# Patient Record
Sex: Female | Born: 1970 | Race: Black or African American | Hispanic: No | Marital: Single
Health system: Midwestern US, Community
[De-identification: ages and names within clinical notes are randomized; demographics above are authoritative.]

## PROBLEM LIST (undated history)

## (undated) MED ORDER — PREDNISONE 50 MG TAB
50 mg | ORAL_TABLET | Freq: Every day | ORAL | Status: AC
Start: ? — End: 2013-03-26

## (undated) MED ORDER — IBUPROFEN 800 MG TAB
800 mg | ORAL_TABLET | Freq: Three times a day (TID) | ORAL | Status: DC | PRN
Start: ? — End: 2013-03-23

## (undated) MED ORDER — CEPHALEXIN 500 MG CAP
500 mg | ORAL_CAPSULE | Freq: Two times a day (BID) | ORAL | Status: AC
Start: ? — End: 2012-11-23

## (undated) MED ORDER — HYDROCODONE-ACETAMINOPHEN 5 MG-500 MG TAB
5-500 mg | ORAL_TABLET | ORAL | Status: AC | PRN
Start: ? — End: 2007-09-08

## (undated) MED ORDER — ONDANSETRON 8 MG TAB, RAPID DISSOLVE
8 mg | ORAL_TABLET | Freq: Three times a day (TID) | ORAL | Status: DC | PRN
Start: ? — End: 2013-03-23

## (undated) MED ORDER — NAPROXEN 500 MG TAB
500 mg | ORAL_TABLET | Freq: Two times a day (BID) | ORAL | Status: AC
Start: ? — End: 2013-04-07

## (undated) MED ORDER — TRIMETHOPRIM-SULFAMETHOXAZOLE 160 MG-800 MG TAB
160-800 mg | ORAL_TABLET | Freq: Two times a day (BID) | ORAL | Status: AC
Start: ? — End: 2007-09-11

---
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
## 2006-12-30 NOTE — ED Notes (Signed)
I have reviewed discharge instructions with the patient.  The patient verbalized understanding.

## 2007-09-01 NOTE — ED Provider Notes (Signed)
HPI Comments: Patient c/o painful sore that have increased over the past 2 days. She started with 2 lesions under her right arm 2 days ago. 1 of them ruptured on its own and expressed yellow drainage. 2 more have formed on her lower abdomen. Denies fever. She admits to exposure to MRSA from her grandmother about 1 year ago.     Skin Problem  The history is provided by the patient. This is a new problem. The current episode started 2 days ago. The problem has been gradually worsening.        No past medical history on file.     Past Surgical History   Procedure Date   ??? Hx orthopaedic      right arm           No family history on file.     History   Social History   ??? Marital Status: Single     Spouse Name: N/A     Number of Children: N/A   ??? Years of Education: N/A   Occupational History   ??? Not on file.   Social History Main Topics   ??? Tobacco Use: Yes -- 0.5 packs/day   ??? Alcohol Use: No   ??? Drug Use: No   ??? Sexually Active:    Other Topics Concern   ??? Not on file   Social History Narrative   ??? No narrative on file           ALLERGIES: Review of patient's allergies indicates no known allergies.      Review of Systems   Constitutional: Negative for fever and chills.   Gastrointestinal: Negative for nausea and vomiting.   Skin: Positive for wound.   All other systems reviewed and are negative.      Filed Vitals:    09/01/2007  6:57 PM   BP: 110/70   Pulse: 68   Temp: 98.9 ??F (37.2 ??C)   Resp: 18   Height: 5\' 6"  (1.676 m)   Weight: 115 lb (52.164 kg)   SpO2: 98%              Physical Exam   Nursing note and vitals reviewed.  Constitutional: She is oriented. She appears well-developed and well-nourished. She appears not diaphoretic. No distress.   HENT:   Head: Normocephalic and atraumatic.   Nose: Nose normal.   Mouth/Throat: Oropharynx is clear and moist.   Eyes: Conjunctivae and extraocular motions are normal. Pupils are equal, round, and reactive to light.   Neck: Normal range of motion. Neck supple.    Cardiovascular: Normal rate, regular rhythm and normal heart sounds.    Pulmonary/Chest: Effort normal and breath sounds normal.   Musculoskeletal: Normal range of motion. She exhibits no edema and no tenderness.   Neurological: She is alert and oriented.   Skin: Skin is warm and dry. She is not diaphoretic.                 Coding

## 2007-11-21 MED ORDER — METHYLPREDNISOLONE 4 MG TABS IN A DOSE PACK
4 mg | PACK | ORAL | Status: DC
Start: 2007-11-21 — End: 2008-02-08

## 2007-11-21 NOTE — ED Notes (Signed)
I have reviewed discharge instructions with the patient.  The patient verbalized understanding.  I have given the patient written information about [facility].   To make follow up appt. with referral physician.  Return if gets worse.

## 2007-11-21 NOTE — ED Provider Notes (Signed)
HPI Comments: 37 bf complains of facial swelling started today. No sob, nausea or vomiting. No fever.         No past medical history on file.     Past Surgical History   Procedure Date   ??? Hx orthopaedic      right arm           No family history on file.     History   Social History   ??? Marital Status: Single     Spouse Name: N/A     Number of Children: N/A   ??? Years of Education: N/A   Occupational History   ??? Not on file.   Social History Main Topics   ??? Tobacco Use: Yes -- 0.5 packs/day   ??? Alcohol Use: No   ??? Drug Use: No   ??? Sexually Active:    Other Topics Concern   ??? Not on file   Social History Narrative   ??? No narrative on file           ALLERGIES: Review of patient's allergies indicates no known allergies.      Review of Systems   HENT: Positive for facial swelling.    All other systems reviewed and are negative.      Filed Vitals:    11/21/2007 10:27 AM   BP: 92/64   Pulse: 64   Temp: 97.2 ??F (36.2 ??C)   Resp: 18   Height: 5\' 6"  (1.676 m)   Weight: 115 lb (52.164 kg)              Physical Exam   Nursing note and vitals reviewed.  Constitutional: She is oriented. She appears well-developed and well-nourished.   HENT:   Head: Normocephalic and atraumatic.   Right Ear: External ear normal.   Left Ear: External ear normal.   Nose: Nose normal.   Mouth/Throat: Oropharynx is clear and moist.   Eyes: Conjunctivae and extraocular motions are normal. Pupils are equal, round, and reactive to light.   Neck: Normal range of motion. Neck supple.   Cardiovascular: Normal rate, regular rhythm, normal heart sounds and intact distal pulses.    Pulmonary/Chest: Effort normal and breath sounds normal. No respiratory distress. She has no wheezes. She has no rales. She exhibits no tenderness.   Abdominal: Soft. Bowel sounds are normal.   Musculoskeletal: Normal range of motion.   Neurological: She is alert and oriented.   Skin: Skin is warm and dry.         No obvious facial assymetry appreciated. No erythenma or induration.             Coding

## 2007-11-21 NOTE — ED Notes (Signed)
Pt states she awoke a few days ago with pain on the right side of her face.  No swelling or deformity noted to face at this time.

## 2008-02-08 MED ORDER — CEPHALEXIN 500 MG CAP
500 mg | ORAL_CAPSULE | Freq: Four times a day (QID) | ORAL | Status: AC
Start: 2008-02-08 — End: 2008-02-15

## 2008-02-08 NOTE — ED Provider Notes (Signed)
Patient is a 37 y.o. female presenting with cough. The history is provided by the patient.   Cough  This is a new problem. The current episode started more than 1 week ago. The problem occurs constantly. The problem has not changed since onset. The cough is non-productive. There has been no fever. Associated symptoms include headaches. Treatments Tried: otc cold meds. The treatment provided mild relief. She is a smoker.        No past medical history on file.     Past Surgical History   Procedure Date   ??? Hx orthopaedic      right arm           No family history on file.     History   Social History   ??? Marital Status: Single     Spouse Name: N/A     Number of Children: N/A   ??? Years of Education: N/A   Occupational History   ??? Not on file.   Social History Main Topics   ??? Tobacco Use: Yes -- 0.5 packs/day   ??? Alcohol Use: Yes      every other day   ??? Drug Use: No   ??? Sexually Active:    Other Topics Concern   ??? Not on file   Social History Narrative   ??? No narrative on file           ALLERGIES: Review of patient's allergies indicates no known allergies.      Review of Systems   Respiratory: Positive for cough.    Neurological: Positive for headaches.   All other systems reviewed and are negative.      Filed Vitals:    02/08/2008  9:35 AM   BP: 113/62   Pulse: 72   Temp: 97.8 ??F (36.6 ??C)   Resp: 18   Height: 5\' 6"  (1.676 m)   Weight: 115 lb (52.164 kg)   SpO2: 99%              Physical Exam   Nursing note and vitals reviewed.  Constitutional: She is oriented. She appears well-developed and well-nourished. No distress.   HENT:   Head: Normocephalic and atraumatic.   Nose: Nose normal.   Mouth/Throat: Oropharynx is clear and moist.        tms not seen due to cerumen   Eyes: Conjunctivae and extraocular motions are normal. Pupils are equal, round, and reactive to light.   Neck: Normal range of motion. Neck supple.   Cardiovascular: Normal rate and regular rhythm.     Pulmonary/Chest: Effort normal and breath sounds normal. No respiratory distress. She has no wheezes.   Abdominal: Soft. Bowel sounds are normal.   Musculoskeletal: Normal range of motion. She exhibits no edema.   Neurological: She is alert and oriented.   Skin: Skin is warm.            MDM Coding   Reviewed: previous chart, nursing note and vitals          Procedures

## 2008-02-21 NOTE — ED Notes (Signed)
This chart was signed for an administrative requirement since I was present during the assigned shift of this mid-level.  I had no direct or indirect contact with this patient nor was I involved in the management or disposition of this patient.

## 2008-11-25 MED ORDER — PROMETHAZINE 25 MG TAB
25 mg | ORAL_TABLET | Freq: Four times a day (QID) | ORAL | Status: DC | PRN
Start: 2008-11-25 — End: 2008-11-25

## 2008-11-25 MED ORDER — PROMETHAZINE 6.25 MG/5 ML SYRUP
6.25 mg/5 mL | Freq: Four times a day (QID) | ORAL | Status: AC | PRN
Start: 2008-11-25 — End: 2008-12-02

## 2008-11-25 NOTE — ED Provider Notes (Signed)
HPI Comments: She is here today c/o N/V/D for 2 day. A/so, c/o of back pain for 3-4 months, no change. Her kids had same symptoms over past week. Denies fever, chill, urinary complaints, vaginal discharge or bleeding. She, also, has athletes foot and currently putting clotramizole on it.     Patient is a 38 y.o. female presenting with nausea. The history is provided by the patient.   Nausea   This is a new problem. The current episode started 2 days ago. The problem occurs continuously. The problem has not changed since onset. The emesis has an appearance of stomach contents. There has been no fever. Associated symptoms include diarrhea. Pertinent negatives include no chills, no fever, no sweats, no abdominal pain, no headaches, no arthralgias, no myalgias, no cough and no URI. vomit x 1 yesterday, none today. The patient is not pregnant.Risk factors include ill contacts.        No past medical history on file.     Past Surgical History   Procedure Date   ??? Hx orthopaedic      right arm           No family history on file.     History   Social History   ??? Marital Status: Single     Spouse Name: N/A     Number of Children: N/A   ??? Years of Education: N/A   Occupational History   ??? Not on file.   Social History Main Topics   ??? Tobacco Use: Yes -- 0.5 packs/day   ??? Alcohol Use: Yes      every other day   ??? Drug Use: No   ??? Sexually Active: Yes     Birth Control/ Protection: None   Other Topics Concern   ??? Not on file   Social History Narrative   ??? No narrative on file           ALLERGIES: Review of patient's allergies indicates no known allergies.      Review of Systems   Constitutional: Negative.  Negative for fever and chills.   HENT: Negative.  Negative for ear pain, congestion, sore throat, rhinorrhea, sneezing, drooling, mouth sores, trouble swallowing, neck pain, neck stiffness, voice change, postnasal drip and sinus pressure.    Eyes: Negative.  Negative for pain and redness.    Respiratory: Negative.  Negative for cough, chest tightness and shortness of breath.    Cardiovascular: Negative.  Negative for chest pain, palpitations and leg swelling.   Gastrointestinal: Positive for nausea, vomiting and diarrhea. Negative for abdominal pain and constipation.   Genitourinary: Negative.  Negative for dysuria, frequency, hematuria, flank pain, vaginal bleeding, vaginal discharge, difficulty urinating, genital sore, vaginal pain, menstrual problem, pelvic pain and dyspareunia.   Musculoskeletal: Positive for back pain. Negative for myalgias and arthralgias.   Skin: Negative.  Negative for rash.   Neurological: Negative.  Negative for dizziness, light-headedness and headaches.   Psychiatric/Behavioral: Negative.    All other systems reviewed and are negative.        Filed Vitals:    11/25/2008  4:03 PM   BP: 120/67   Pulse: 70   Temp: 95.4 ??F (35.2 ??C)   Resp: 18   Height: 5\' 6"  (1.676 m)   Weight: 115 lb (52.164 kg)              Physical Exam   Nursing note and vitals reviewed.  Constitutional: She is oriented to person, place, and time. She appears  well-developed and well-nourished. No distress.   HENT:   Head: Normocephalic and atraumatic.   Eyes: Conjunctivae are normal.   Neck: Normal range of motion. Neck supple.   Cardiovascular: Normal rate, regular rhythm, normal heart sounds and intact distal pulses.    Pulmonary/Chest: Effort normal and breath sounds normal. No respiratory distress. She has no wheezes. She has no rales. She exhibits no tenderness.   Abdominal: Soft. Bowel sounds are normal. She exhibits no distension. No tenderness. She has no rebound and no guarding.   Musculoskeletal: Normal range of motion.        Lumbar back: She exhibits pain. She exhibits normal range of motion, no tenderness, no bony tenderness, no swelling, no edema, no deformity, no laceration, no spasm and normal pulse.        Back:     Neurological: She is alert and oriented to person, place, and time.    Skin: Skin is warm and dry. She is not diaphoretic.   Psychiatric: She has a normal mood and affect. Her behavior is normal.        MDM Coding   Reviewed: nursing note and vitals        Procedures    A&O in NAD, VSS.

## 2008-11-25 NOTE — ED Notes (Signed)
I have reviewed discharge instructions with the patient.  The patient verbalized understanding.

## 2008-11-27 NOTE — ED Notes (Signed)
Note signed for administrative purposes only. I did not see or have any input in the care of this patient.

## 2009-03-02 NOTE — ED Notes (Signed)
Pt did not answer call back to room.

## 2009-03-02 NOTE — ED Notes (Signed)
Pt told registration they were leaving.

## 2009-03-04 MED ORDER — DOXYCYCLINE HYCLATE 100 MG TAB
100 mg | ORAL_TABLET | Freq: Two times a day (BID) | ORAL | Status: AC
Start: 2009-03-04 — End: 2009-03-11

## 2009-03-04 MED ORDER — CHLORPHENIRAMINE-PHENYLEPHRINE-DM 2 MG-6 MG-15 MG/5 ML ORAL SYRUP
2-6-15 mg/5 mL | Freq: Four times a day (QID) | ORAL | Status: DC | PRN
Start: 2009-03-04 — End: 2010-01-16

## 2009-03-04 NOTE — ED Notes (Signed)
To room 11 ambulatory  C/o  Chest congestion, cough, also has diarrhea

## 2009-03-04 NOTE — ED Provider Notes (Signed)
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   No childhood asthma.    Started work @ [employer] 2 months ago so had been treating sinus sx's adequately w/ pre-shift DayQuil.    Patient is a 38 y.o. female presenting with nasal congestion. The history is provided by the patient.   Nasal Congestion   Associated symptoms include congestion, cough and rhinorrhea. Pertinent negatives include no chills, no ear pain, no sore throat, no shortness of breath and no chest pain.        No past medical history on file.     Past Surgical History   Procedure Date   ??? Hx orthopaedic      right arm           No family history on file.     History   Social History   ??? Marital Status: Single     Spouse Name: N/A     Number of Children: N/A   ??? Years of Education: N/A   Occupational History   ??? Not on file.   Social History Main Topics   ??? Smoking status: Current Everyday Smoker -- 0.5 packs/day   ??? Smokeless tobacco: Never Used   ??? Alcohol Use: Yes      every other day   ??? Drug Use: No   ??? Sexually Active: Yes     Birth Control/ Protection: None   Other Topics Concern   ??? Not on file   Social History Narrative   ??? No narrative on file           ALLERGIES: Review of patient's allergies indicates no known allergies.      Review of Systems   Constitutional: Positive for appetite change and fatigue. Negative for fever, chills and activity change.   HENT: Positive for congestion, rhinorrhea, voice change and postnasal drip. Negative for ear pain and sore throat.    Respiratory: Positive for cough and chest tightness. Negative for shortness of breath.    Cardiovascular: Negative for chest pain.   Gastrointestinal: Negative for vomiting and abdominal pain.   Musculoskeletal: Negative for myalgias.   Neurological: Negative for light-headedness.   All other systems reviewed and are negative.        Filed Vitals:    03/04/2009  8:09 AM   BP: 99/61   Pulse: 70    Temp: 99.1 ??F (37.3 ??C)   Resp: 17   Height: 5\' 6"  (1.676 m)   Weight: 120 lb (54.432 kg)   SpO2: 100%              Physical Exam   Vitals reviewed.  Constitutional: She appears well-developed. No distress.   HENT:   Head: Normocephalic.   Right Ear: Tympanic membrane normal.   Left Ear: Tympanic membrane normal.   Nose: Mucosal edema and rhinorrhea present. Right sinus exhibits no maxillary sinus tenderness. Left sinus exhibits no maxillary sinus tenderness.   Mouth/Throat: Posterior oropharyngeal erythema (cobblestoning) present. No oropharyngeal exudate or posterior oropharyngeal edema.   Cardiovascular: Normal rate.    Pulmonary/Chest: Effort normal. No accessory muscle usage. Not tachypneic. No respiratory distress. She has no wheezes. She has rhonchi. She has no rales.   Abdominal: Soft.   Musculoskeletal: She exhibits no edema.   Neurological: She is alert.   Skin: Skin is warm.   Psychiatric: She has a normal mood and affect.  Coding    Room Air Oxygen saturation normal, no intervention necessary    Procedures

## 2009-03-04 NOTE — ED Notes (Signed)
Instructions to patient  Work note, 2 prescriptions, bronchitis in adults  Information sheet  Pt. Understands instructions

## 2010-01-16 MED ORDER — KETOROLAC TROMETHAMINE 60 MG/2 ML IM
60 mg/2 mL | INTRAMUSCULAR | Status: AC
Start: 2010-01-16 — End: 2010-01-16
  Administered 2010-01-16: 06:00:00 via INTRAMUSCULAR

## 2010-01-16 MED ORDER — ACETAMINOPHEN-CODEINE 300 MG-30 MG TAB
300-30 mg | ORAL_TABLET | ORAL | Status: DC | PRN
Start: 2010-01-16 — End: 2010-03-24

## 2010-01-16 MED ORDER — DICLOFENAC 50 MG TAB
50 mg | ORAL_TABLET | Freq: Three times a day (TID) | ORAL | Status: DC
Start: 2010-01-16 — End: 2010-03-24

## 2010-01-16 MED FILL — KETOROLAC TROMETHAMINE 60 MG/2 ML IM: 60 mg/2 mL | INTRAMUSCULAR | Qty: 2

## 2010-01-16 NOTE — ED Notes (Signed)
I have reviewed discharge instructions with the patient.  The patient verbalized understanding. Pt ambulatory out of ER in NAD at this time, VS as noted, self to drive.

## 2010-01-16 NOTE — ED Provider Notes (Signed)
Patient is a 39 y.o. female presenting with shoulder pain. The history is provided by the patient.   Shoulder Pain   Incident onset: pain constantly for 1 month.  reports repetative motiion as possible cause.  works doing Education officer, environmental. Injury mechanism: as above. The right shoulder is affected. The pain is at a severity of 10/10. The pain has been constant since onset. The pain does not radiate. She has no other injuries. There is no history of shoulder surgery. Pertinent negatives include no numbness, no muscle weakness and no tingling. She reports no foreign bodies present.        No past medical history on file.     Past Surgical History   Procedure Date   ??? Hx orthopaedic      right arm           No family history on file.     History   Social History   ??? Marital Status: Single     Spouse Name: N/A     Number of Children: N/A   ??? Years of Education: N/A   Occupational History   ??? Not on file.   Social History Main Topics   ??? Smoking status: Current Everyday Smoker -- 0.5 packs/day   ??? Smokeless tobacco: Never Used   ??? Alcohol Use: Yes      every other day   ??? Drug Use: No   ??? Sexually Active: Yes     Birth Control/ Protection: None   Other Topics Concern   ??? Not on file   Social History Narrative   ??? No narrative on file                    ALLERGIES: Review of patient's allergies indicates no known allergies.      Review of Systems   Cardiovascular: Negative for chest pain.   Musculoskeletal: Positive for arthralgias.   Neurological: Negative.  Negative for tingling and numbness.   All other systems reviewed and are negative.        Filed Vitals:    01/16/10 0024   BP: 116/79   Pulse: 78   Temp: 98.1 ??F (36.7 ??C)   Resp: 18   Height: 5\' 6"  (1.676 m)   Weight: 125 lb (56.7 kg)   SpO2: 99%              Physical Exam   Constitutional: She is oriented to person, place, and time. She appears well-developed and well-nourished. No distress.        Sleeping on cart upon entering room     HENT:    Head: Normocephalic and atraumatic.   Eyes: EOM are normal. Pupils are equal, round, and reactive to light.   Neck: Normal range of motion. Neck supple.   Pulmonary/Chest: Effort normal.   Musculoskeletal:        Right shoulder: She exhibits decreased range of motion, tenderness, pain and decreased strength. She exhibits no deformity.        Mild impingement signs   Neurological: She is alert and oriented to person, place, and time. She displays normal reflexes. She exhibits normal muscle tone.   Skin: Skin is warm and dry.   Psychiatric: She has a normal mood and affect. Her behavior is normal.        MDM    Procedures

## 2010-01-16 NOTE — ED Notes (Signed)
Pt states pain to right shoulder for over a month. Pt states it is in relation to surgery she had on right forearm. Pt in NAD at this time.

## 2010-03-24 MED ORDER — AMOXICILLIN 400 MG/5 ML ORAL SUSP
400 mg/5 mL | Freq: Two times a day (BID) | ORAL | Status: AC
Start: 2010-03-24 — End: 2010-04-03

## 2010-03-24 MED ORDER — HYDROCODONE-ACETAMINOPHEN 7.5 MG-500 MG/15 ML (15 ML) ORAL SOLN
Freq: Four times a day (QID) | ORAL | Status: DC | PRN
Start: 2010-03-24 — End: 2011-01-08

## 2010-03-24 MED ORDER — PROMETHAZINE 25 MG/5 ML ORAL SYRUP
25 mg/5 mL | Freq: Three times a day (TID) | ORAL | Status: DC | PRN
Start: 2010-03-24 — End: 2011-01-08

## 2010-03-24 NOTE — ED Provider Notes (Addendum)
Patient is a 40 y.o. female presenting with dental problem. The history is provided by the patient.   Dental Pain   This is a new problem. The current episode started 12 to 24 hours ago. The problem occurs constantly. The problem has been gradually worsening. The pain is located in the right lower mouth.The quality of the pain is aching.  The pain is at a severity of 10/10. Associated symptoms include gum redness.There was no vomiting, no nausea, no fever, no swelling, no chest pain, no shortness of breath, no headaches and no drainage. She has tried nothing for the symptoms. The patient has no cardiac history.       No past medical history on file.     Past Surgical History   Procedure Date   ??? Hx orthopaedic      right arm           No family history on file.     History   Social History   ??? Marital Status: Single     Spouse Name: N/A     Number of Children: N/A   ??? Years of Education: N/A   Occupational History   ??? Not on file.   Social History Main Topics   ??? Smoking status: Current Everyday Smoker -- 0.5 packs/day   ??? Smokeless tobacco: Never Used   ??? Alcohol Use: No      sober since 0101/12   ??? Drug Use: No   ??? Sexually Active: Yes     Birth Control/ Protection: None   Other Topics Concern   ??? Not on file   Social History Narrative   ??? No narrative on file                    ALLERGIES: Review of patient's allergies indicates no known allergies.      Review of Systems   Constitutional: Negative.    HENT: Positive for dental problem.    Eyes: Negative.    Respiratory: Negative.    Cardiovascular: Negative.    Gastrointestinal: Negative.    Genitourinary: Negative.    Musculoskeletal: Negative.    Skin: Negative.    Neurological: Negative.    Hematological: Negative.    Psychiatric/Behavioral: Negative.    All other systems reviewed and are negative.        Filed Vitals:    03/24/10 1216   BP: 124/73   Pulse: 83   Temp: 100.6 ??F (38.1 ??C)   Resp: 18   Height: 5\' 6"  (1.676 m)   Weight: 115 lb (52.164 kg)    SpO2: 99%              Physical Exam   Nursing note and vitals reviewed.  Constitutional: She is oriented to person, place, and time. She appears well-developed and well-nourished.   HENT:   Head: Normocephalic and atraumatic.   Right Ear: External ear normal.   Left Ear: External ear normal.   Nose: Nose normal.   Mouth/Throat: Uvula is midline and oropharynx is clear and moist. Abnormal dentition. Dental abscesses and dental caries present.   Eyes: Conjunctivae and EOM are normal. Pupils are equal, round, and reactive to light.   Neck: Normal range of motion. Neck supple.   Cardiovascular: Normal rate, regular rhythm, normal heart sounds and intact distal pulses.    Pulmonary/Chest: Effort normal and breath sounds normal.   Abdominal: Soft. Bowel sounds are normal.   Musculoskeletal: Normal range of motion.   Neurological:  She is alert and oriented to person, place, and time. She has normal reflexes.   Skin: Skin is warm and dry.   Psychiatric: She has a normal mood and affect. Her behavior is normal. Judgment and thought content normal.        MDM    Procedures    The patient was observed in the ED.      I discussed the results of all labs, procedures, radiographs, and treatments with the patient and available family.  Treatment plan is agreed upon and the patient is ready for discharge.  All voiced understanding of the discharge plan and medication instructions or changes as appropriate.  Questions about treatment in the ED were answered.  All were encouraged to return should symptoms worsen or new problems develop.  Pt declines shot here in ED today

## 2010-03-24 NOTE — ED Notes (Signed)
I have reviewed discharge instructions with the patient.  The patient verbalized understanding. To follow up with primary physician and return with worsening.

## 2011-01-08 LAB — URIC ACID: Uric acid: 3.3 MG/DL (ref 2.6–6.0)

## 2011-01-08 MED ORDER — HYDROCODONE-ACETAMINOPHEN 5 MG-325 MG TAB
5-325 mg | ORAL_TABLET | ORAL | Status: DC | PRN
Start: 2011-01-08 — End: 2011-01-16

## 2011-01-08 MED ORDER — TOLNAFTATE 1 % TOPICAL SPRAY
1 % | CUTANEOUS | Status: DC
Start: 2011-01-08 — End: 2011-01-16

## 2011-01-08 NOTE — ED Notes (Signed)
.  I have reviewed discharge instructions with the patient.  The patient verbalized understanding.

## 2011-01-08 NOTE — ED Provider Notes (Signed)
HPI Comments: Pt here today with a c/o atraumatic BL foot pain "for years".    Patient is a 40 y.o. female presenting with foot pain. The history is provided by the patient.   Foot Pain   This is a chronic problem. The pain is present in the right foot and left foot. The quality of the pain is described as aching. The pain is at a severity of 10/10. The pain is mild. Pertinent negatives include no numbness, full range of motion, no stiffness, no tingling, no itching, no back pain and no neck pain. The symptoms are aggravated by movement. There has been no history of extremity trauma.        No past medical history on file.     Past Surgical History   Procedure Date   ??? Hx orthopaedic      right arm         No family history on file.     History     Social History   ??? Marital Status: Life Partner     Spouse Name: N/A     Number of Children: N/A   ??? Years of Education: N/A     Occupational History   ??? Not on file.     Social History Main Topics   ??? Smoking status: Current Everyday Smoker -- 0.5 packs/day   ??? Smokeless tobacco: Never Used   ??? Alcohol Use: No      sober since 0101/12   ??? Drug Use: No   ??? Sexually Active: Yes     Birth Control/ Protection: None     Other Topics Concern   ??? Not on file     Social History Narrative   ??? No narrative on file                  ALLERGIES: Review of patient's allergies indicates no known allergies.      Review of Systems   Constitutional: Negative.    HENT: Negative.  Negative for neck pain.    Eyes: Negative.    Respiratory: Negative.    Cardiovascular: Negative.    Gastrointestinal: Negative.    Genitourinary: Negative.    Musculoskeletal: Negative for myalgias, back pain, joint swelling, arthralgias, gait problem and stiffness.        BL foot pain     Skin: Negative.  Negative for itching.   Neurological: Negative.  Negative for tingling and numbness.   Hematological: Negative.    Psychiatric/Behavioral: Negative.        Filed Vitals:    01/08/11 1115   BP: 101/61    Pulse: 63   Temp: 98.9 ??F (37.2 ??C)   Resp: 18   Height: 5\' 6"  (1.676 m)   Weight: 52.164 kg (115 lb)   SpO2: 100%            Physical Exam   Nursing note and vitals reviewed.  Constitutional: She is oriented to person, place, and time. Vital signs are normal. She appears well-developed and well-nourished. She is active.  Non-toxic appearance. She does not have a sickly appearance. She does not appear ill. No distress.   HENT:   Head: Normocephalic and atraumatic.   Eyes: Conjunctivae and EOM are normal. Pupils are equal, round, and reactive to light.   Neck: Normal range of motion. Neck supple.   Cardiovascular: Normal rate, regular rhythm, normal heart sounds and intact distal pulses.    Pulmonary/Chest: Effort normal and breath sounds normal.  Abdominal: Soft. Bowel sounds are normal.   Musculoskeletal:        Right foot: She exhibits normal range of motion, no tenderness, no bony tenderness, no swelling, normal capillary refill, no crepitus, no deformity and no laceration.        Left foot: She exhibits normal range of motion, no tenderness, no bony tenderness, no swelling, normal capillary refill, no crepitus, no deformity and no laceration.   Neurological: She is alert and oriented to person, place, and time.   Skin: Skin is warm and dry. She is not diaphoretic.        Psychiatric: She has a normal mood and affect. Her behavior is normal. Judgment and thought content normal.        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 with the patient and available family.?? A treatment plan was developed in conjunction with the patient and was agreed upon. The patient is ready for discharge at this time.?? All voiced understanding of the discharge plan and medication instructions or changes as appropriate.?? Questions about treatment in the ED were answered.?? The patient was encouraged to return should symptoms worsen or new problems develop. A follow up physician was provided to the patient on the discharge papers.

## 2011-01-16 LAB — CBC WITH AUTOMATED DIFF
ABS. BASOPHILS: 0 10*3/uL (ref 0.0–0.2)
ABS. EOSINOPHILS: 0.2 10*3/uL (ref 0.0–0.8)
ABS. IMM. GRANS.: 0 10*3/uL (ref 0.0–0.5)
ABS. LYMPHOCYTES: 1.6 10*3/uL (ref 0.5–4.6)
ABS. MONOCYTES: 1.1 10*3/uL (ref 0.1–1.3)
ABS. NEUTROPHILS: 6 10*3/uL (ref 1.7–8.2)
BASOPHILS: 0 % (ref 0.0–2.0)
EOSINOPHILS: 2 % (ref 0.5–7.8)
HCT: 38.7 % (ref 35.8–46.3)
HGB: 13.1 g/dL (ref 11.7–15.4)
IMMATURE GRANULOCYTES: 0.2 % (ref 0.0–5.0)
LYMPHOCYTES: 18 % (ref 13–44)
MCH: 30.1 PG (ref 26.1–32.9)
MCHC: 33.9 g/dL (ref 31.4–35.0)
MCV: 89 FL (ref 79.6–97.8)
MONOCYTES: 12 % (ref 4.0–12.0)
MPV: 10 FL — ABNORMAL LOW (ref 10.8–14.1)
NEUTROPHILS: 68 % (ref 43–78)
PLATELET: 228 10*3/uL (ref 150–450)
RBC: 4.35 M/uL (ref 4.05–5.25)
RDW: 13.5 % (ref 11.9–14.6)
WBC: 9 10*3/uL (ref 4.3–11.1)

## 2011-01-16 MED ORDER — TRIMETHOPRIM-SULFAMETHOXAZOLE 160 MG-800 MG TAB
160-800 mg | ORAL | Status: DC
Start: 2011-01-16 — End: 2011-01-16

## 2011-01-16 MED ORDER — TRIMETHOPRIM-SULFAMETHOXAZOLE 160 MG-800 MG TAB
160-800 mg | ORAL_TABLET | Freq: Two times a day (BID) | ORAL | Status: AC
Start: 2011-01-16 — End: 2011-01-23

## 2011-01-16 MED ORDER — TRIMETHOPRIM-SULFAMETHOXAZOLE 40 MG-200 MG/5 ML ORAL SUSP
200-40 mg/5 mL | Freq: Two times a day (BID) | ORAL | Status: AC
Start: 2011-01-16 — End: 2011-01-26

## 2011-01-16 MED ADMIN — sulfamethoxazole-trimethoprim (BACTRIM;SEPTRA) 200-40 mg/5 mL oral suspension 10 mL: ORAL | @ 07:00:00 | NDC 00472128533

## 2011-01-16 NOTE — ED Notes (Signed)
C/o wound to right foot, between 2nd and 3rd toes. States seen by [employer] and  Placed on amoxicillin couple days ago. States when removed sock this evening noticed blood and puss to sock. States told by [place] to come to ed.

## 2011-01-16 NOTE — ED Provider Notes (Signed)
HPI Comments: 40 yo black female presents with purulent discharge from her right foot.  Patient has had tinea pedis and was seen buy her PCP 3 days ago for cellulitis as a result of the skin cracking-she was placed on amoxil, but this has become more severe with increasing pain and now purulent discharge.  No fever, chills, sweats and no nausea or vomiting.  No DM.    Patient is a 40 y.o. female presenting with foot pain. The history is provided by the patient.   Foot Pain   This is a new problem. The current episode started more than 2 days ago. The problem has been gradually worsening. The pain is present in the right foot and right toes. The pain is at a severity of 9/10. Pertinent negatives include no numbness, full range of motion, no tingling and no itching. The symptoms are aggravated by palpation and movement. Treatments tried: Antibiotics. The treatment provided no relief. There has been no history of extremity trauma.        No past medical history on file.     Past Surgical History   Procedure Date   ??? Hx orthopaedic      right arm         No family history on file.     History     Social History   ??? Marital Status: Life Partner     Spouse Name: N/A     Number of Children: N/A   ??? Years of Education: N/A     Occupational History   ??? Not on file.     Social History Main Topics   ??? Smoking status: Current Everyday Smoker -- 0.5 packs/day   ??? Smokeless tobacco: Never Used   ??? Alcohol Use: No      sober since 0101/12   ??? Drug Use: No   ??? Sexually Active: Yes     Birth Control/ Protection: None     Other Topics Concern   ??? Not on file     Social History Narrative   ??? No narrative on file                  ALLERGIES: Review of patient's allergies indicates no known allergies.      Review of Systems   Constitutional: Positive for activity change. Negative for fever, chills, diaphoresis, appetite change and fatigue.   Gastrointestinal: Negative for nausea and vomiting.    Musculoskeletal: Negative for joint swelling and arthralgias.   Skin: Positive for color change and wound. Negative for itching, pallor and rash.   Neurological: Negative for dizziness, tingling, light-headedness and numbness.   Psychiatric/Behavioral: Negative for confusion.       Filed Vitals:    01/16/11 0013   BP: 117/68   Pulse: 62   Temp: 98.9 ??F (37.2 ??C)   Resp: 16   Height: 5\' 6"  (1.676 m)   Weight: 52.164 kg (115 lb)   SpO2: 100%            Physical Exam   Nursing note and vitals reviewed.  Constitutional: She appears well-developed and well-nourished. She appears distressed.   HENT:   Head: Normocephalic and atraumatic.   Eyes: Conjunctivae and EOM are normal. Right eye exhibits no discharge. Left eye exhibits no discharge. No scleral icterus.   Neck: Normal range of motion. Neck supple.   Cardiovascular: Normal rate.    Pulmonary/Chest: Effort normal. No respiratory distress.   Musculoskeletal: She exhibits edema and tenderness.  Moderate edema and erythema of the dorsal aspect of the right foot with a draining abscess on the dorsal aspect of the 3rd right toe.  No ascending lymphangitis.   Neurological: She is alert. She exhibits normal muscle tone. Coordination normal.   Skin: Skin is warm and dry. No rash noted. She is not diaphoretic. There is erythema. No pallor.        Tinea pedis bilaterally, but patient has a draining abscess on the right foot.   Psychiatric: She has a normal mood and affect.        MDM     Differential Diagnosis; Clinical Impression; Plan:     CBC WNL.  Amount and/or Complexity of Data Reviewed:   Clinical lab tests:  Ordered and reviewed  Discussion of test results with the performing providers:  No   Decide to obtain previous medical records or to obtain history from someone other than the patient:  No   Obtain history from someone other than the patient:  No   Discuss the patient with another provider:  No   Risk of Significant Complications, Morbidity, and/or Mortality:   Presenting problems:  Moderate  Diagnostic procedures:  Moderate  Management options:  Moderate  Progress:   Patient progress:  Stable      Procedures

## 2011-01-16 NOTE — ED Notes (Signed)
I have reviewed discharge instructions with the patient.  The patient verbalized understanding.  Questions about medication answered.  Prescription for Bactrim liquid given.  Discharged to lobby via WC.

## 2011-01-17 NOTE — Progress Notes (Signed)
Quick Note:    Pt on septra  ______

## 2011-01-18 LAB — CULTURE, WOUND W GRAM STAIN: GRAM STAIN: 0

## 2011-01-18 NOTE — Progress Notes (Signed)
Quick Note:    Susceptible to Bactrim, continue current medication.  ______

## 2011-05-04 NOTE — ED Provider Notes (Addendum)
HPI Comments: Pt. Presents stating that she has had pain to both feet since her surgery to her right foot which was performed by her podiatrist 2 weeks ago. She states she also is aware she has athletes feet and has not been using anything for this. She states she places gauze between her toes as she has done today to minimize this moistness. She states she is "out of" her pain medication for pain related to her surgery. I discussed with pt. That she would need to contact her podiatrist who performed the surgery for this prescription.    The history is provided by the patient.        No past medical history on file.     Past Surgical History   Procedure Date   ??? Hx orthopaedic      right arm, toe         No family history on file.     History     Social History   ??? Marital Status: LIFE PARTNER     Spouse Name: N/A     Number of Children: N/A   ??? Years of Education: N/A     Occupational History   ??? Not on file.     Social History Main Topics   ??? Smoking status: Current Everyday Smoker -- 0.5 packs/day   ??? Smokeless tobacco: Never Used   ??? Alcohol Use: No      occasional   ??? Drug Use: No   ??? Sexually Active: Yes     Birth Control/ Protection: None     Other Topics Concern   ??? Not on file     Social History Narrative   ??? No narrative on file                  ALLERGIES: Review of patient's allergies indicates no known allergies.      Review of Systems   Constitutional: Negative for fever, chills, diaphoresis and fatigue.   Gastrointestinal: Negative for nausea and vomiting.   Musculoskeletal: Positive for arthralgias. Negative for myalgias, joint swelling and gait problem.   Skin: Positive for rash. Negative for color change and wound.   Neurological: Negative for weakness and numbness.   All other systems reviewed and are negative.        Filed Vitals:    05/04/11 0851   BP: 98/55   Pulse: 81   Temp: 98.2 ??F (36.8 ??C)   Resp: 18   Height: 5\' 6"  (1.676 m)   Weight: 50.349 kg (111 lb)   SpO2: 99%            Physical Exam    Nursing note and vitals reviewed.  Constitutional: She is oriented to person, place, and time. Vital signs are normal. She appears well-developed and well-nourished. She is active.  Non-toxic appearance. She does not appear ill. No distress.   Cardiovascular:   Pulses:       Radial pulses are 2+ on the right side, and 2+ on the left side.   Musculoskeletal:        Right foot: She exhibits normal range of motion, no tenderness, no bony tenderness, no swelling, normal capillary refill, no crepitus and no deformity.        Left foot: Normal. She exhibits normal range of motion, no tenderness, no bony tenderness, no swelling, normal capillary refill, no crepitus and no deformity.   Neurological: She is alert and oriented to person, place, and time. She has normal  strength. No cranial nerve deficit or sensory deficit. Coordination and gait normal.   Skin: Skin is warm.        Psychiatric: She has a normal mood and affect. Her speech is normal and behavior is normal.        MDM     Risk of Significant Complications, Morbidity, and/or Mortality:   Presenting problems:  Minimal  Management options:  Minimal  Progress:   Patient progress:  Stable      Procedures

## 2011-05-04 NOTE — ED Notes (Signed)
I have reviewed discharge instructions with the patient.  The patient verbalized understanding.

## 2011-06-01 MED ORDER — PENICILLIN V-K 500 MG TAB
500 mg | ORAL_TABLET | Freq: Four times a day (QID) | ORAL | Status: AC
Start: 2011-06-01 — End: 2011-06-11

## 2011-06-01 MED ORDER — TRAMADOL 50 MG TAB
50 mg | ORAL_TABLET | Freq: Four times a day (QID) | ORAL | Status: DC | PRN
Start: 2011-06-01 — End: 2012-11-16

## 2011-06-01 NOTE — ED Provider Notes (Signed)
HPI Comments: 41 yo female c/o dental pain to the right upper jaw that started about 3 days ago. Patient denies drainage from the site. States it started after eating some chicken wings at dinner. Patient has not taken anything for pain. Patient has not seen a dentist for this problem.       Pmhx:   Shx: 1/2 ppd     Patient is a 41 y.o. female presenting with dental problem. The history is provided by the patient.   Dental Pain   This is a new problem. The current episode started more than 2 days ago. The problem occurs constantly. The problem has not changed since onset.The pain is located in the right upper mouth.The quality of the pain is aching and stabbing.  The pain is moderate. There was no vomiting, no nausea, no fever, no swelling, no chest pain, no shortness of breath, no headaches, no gum redness and no drainage. She has tried nothing for the symptoms.        No past medical history on file.     Past Surgical History   Procedure Date   ??? Hx orthopaedic      right arm, toe         No family history on file.     History     Social History   ??? Marital Status: LIFE PARTNER     Spouse Name: N/A     Number of Children: N/A   ??? Years of Education: N/A     Occupational History   ??? Not on file.     Social History Main Topics   ??? Smoking status: Current Everyday Smoker -- 0.5 packs/day   ??? Smokeless tobacco: Never Used   ??? Alcohol Use: No      occasional   ??? Drug Use: No   ??? Sexually Active: Yes     Birth Control/ Protection: None     Other Topics Concern   ??? Not on file     Social History Narrative   ??? No narrative on file                  ALLERGIES: Review of patient's allergies indicates no known allergies.      Review of Systems   Constitutional: Negative for fever, chills, diaphoresis, fatigue and unexpected weight change.   HENT: Positive for dental problem. Negative for sore throat and trouble swallowing.    Respiratory: Negative for cough, chest tightness and shortness of breath.    Cardiovascular: Negative  for chest pain and palpitations.   Gastrointestinal: Negative for nausea, vomiting, diarrhea and constipation.   Skin: Negative for color change and pallor.   Psychiatric/Behavioral: Negative for behavioral problems, confusion and agitation.       Filed Vitals:    06/01/11 1245   BP: 133/82   Pulse: 74   Temp: 98.3 ??F (36.8 ??C)   Resp: 16   Height: 5\' 6"  (1.676 m)   Weight: 50.349 kg (111 lb)   SpO2: 100%            Physical Exam   Nursing note and vitals reviewed.  Constitutional: She is oriented to person, place, and time. Vital signs are normal. She appears well-developed and well-nourished.  Non-toxic appearance. She does not have a sickly appearance. She does not appear ill. No distress.   HENT:   Head: Normocephalic and atraumatic. No trismus in the jaw.   Right Ear: Hearing, tympanic membrane, external ear and ear  canal normal.   Left Ear: Hearing, tympanic membrane, external ear and ear canal normal.   Nose: Nose normal.   Mouth/Throat: Uvula is midline, oropharynx is clear and moist and mucous membranes are normal. She does not have dentures. No oral lesions. Abnormal dentition. Dental caries present. No dental abscesses, uvula swelling or lacerations.       Eyes: Conjunctivae and EOM are normal. Pupils are equal, round, and reactive to light. Right eye exhibits no discharge. Left eye exhibits no discharge. No scleral icterus.   Neck: Normal range of motion. Neck supple.   Cardiovascular: Normal rate, regular rhythm, S1 normal, S2 normal and normal heart sounds.  Exam reveals no gallop.    No murmur heard.  Pulmonary/Chest: Effort normal and breath sounds normal. No respiratory distress. She has no decreased breath sounds. She has no wheezes. She has no rhonchi. She has no rales.   Neurological: She is alert and oriented to person, place, and time.   Skin: Skin is warm, dry and intact. No abrasion, no bruising, no burn, no ecchymosis, no laceration, no lesion and no rash noted. She is not diaphoretic. No  erythema. No pallor.   Psychiatric: She has a normal mood and affect. Her speech is normal and behavior is normal. Judgment and thought content normal.        MDM     Risk of Significant Complications, Morbidity, and/or Mortality:   Presenting problems:  Low  Diagnostic procedures:  Low  Management options:  Low      Procedures    I have discussed the results of labs, procedures, radiographs, treatments as well as any previous results found within the [facility] [employer] [facility] with the patient and available family.?? A treatment plan was developed in conjunction with the patient and was agreed upon. The patient is ready for discharge at this time.?? All voiced understanding of the discharge plan and medication instructions or changes as appropriate.?? Questions about treatment in the ED were answered.?? The patient was encouraged to return should symptoms worsen or new problems develop. A follow up physician was provided to the patient on the discharge papers.

## 2011-06-01 NOTE — ED Notes (Signed)
Pt reports pain to R upper mouth x 2-3 days, headache and facial pain as well, no OTC meds attempted.

## 2011-06-01 NOTE — ED Notes (Signed)
Copy of discharge instructions given to patient. Denies any questions at this time.

## 2012-11-16 LAB — URINE MICROSCOPIC

## 2012-11-16 LAB — HCG URINE, QL. - POC: Pregnancy test,urine (POC): NEGATIVE

## 2012-11-16 NOTE — ED Notes (Signed)
Blood drawn via butterfly ( L AC ) & sent to lab

## 2012-11-16 NOTE — ED Notes (Signed)
I have reviewed discharge instructions with the patient.  The patient verbalized understanding. Patient ambulatory to lobby in no acute distress. 3 prescriptions provided.    Nabilah Davoli M Senna Lape, RN

## 2012-11-16 NOTE — ED Notes (Signed)
Back in ED 15

## 2012-11-16 NOTE — ED Notes (Signed)
To CT

## 2012-11-16 NOTE — ED Provider Notes (Signed)
I was personally available for consultation in the emergency department.  I have reviewed the chart and agree with the documentation recorded by the MLP, including the assessment, treatment plan, and disposition.  Ammar Moffatt L Shaneika Rossa, MD

## 2012-11-16 NOTE — ED Provider Notes (Signed)
HPI Comments: Pt. Presents stating that she woke up yesterday with right temporal/parietal HA that has persisted today. She states she took a generic form of Excedrin Migraine which slightly relieved headache temporarily. She states she has a hx of migraine headaches. She states she has had nausea, lightheadedness and photophobia. She denies vomiting, ST, visual disturbances or neck pain. She states she smokes 1/2 pdd of cigarettes. LMP 10/26/2012.    The history is provided by the patient.        History reviewed. No pertinent past medical history.     Past Surgical History   Procedure Laterality Date   ??? Hx orthopaedic       right arm, toe         History reviewed. No pertinent family history.     History     Social History   ??? Marital Status: SINGLE     Spouse Name: N/A     Number of Children: N/A   ??? Years of Education: N/A     Occupational History   ??? Not on file.     Social History Main Topics   ??? Smoking status: Current Every Day Smoker -- 0.50 packs/day   ??? Smokeless tobacco: Never Used   ??? Alcohol Use: No      Comment: occasional   ??? Drug Use: No   ??? Sexually Active: Yes     Birth Control/ Protection: None     Other Topics Concern   ??? Not on file     Social History Narrative   ??? No narrative on file                  ALLERGIES: Review of patient's allergies indicates no known allergies.      Review of Systems   Constitutional: Negative for fever, chills, diaphoresis, activity change, appetite change and fatigue.   HENT: Negative for ear pain, congestion, sore throat, facial swelling, rhinorrhea, trouble swallowing, neck pain, neck stiffness, dental problem and postnasal drip.    Eyes: Positive for photophobia. Negative for pain and visual disturbance.   Respiratory: Negative for cough, chest tightness and shortness of breath.    Cardiovascular: Negative for chest pain and palpitations.   Gastrointestinal: Positive for nausea. Negative for vomiting, abdominal pain and diarrhea.   Musculoskeletal: Negative for  myalgias and arthralgias.   Neurological: Positive for light-headedness and headaches. Negative for dizziness, tremors, seizures, syncope, speech difficulty, weakness and numbness.   Psychiatric/Behavioral: Negative for sleep disturbance. The patient is not nervous/anxious.    All other systems reviewed and are negative.        Filed Vitals:    11/16/12 1833   BP: 126/79   Pulse: 70   Temp: 99.2 ??F (37.3 ??C)   Resp: 20   Height: 5\' 6"  (1.676 m)   Weight: 54.432 kg (120 lb)   SpO2: 99%            Physical Exam   Nursing note and vitals reviewed.  Constitutional: She is oriented to person, place, and time. Vital signs are normal. She appears well-developed and well-nourished. She is active.  Non-toxic appearance. She does not appear ill. No distress.   HENT:   Head: Normocephalic and atraumatic.   Right Ear: Tympanic membrane normal.   Left Ear: Tympanic membrane normal.   Nose: Nose normal.   Mouth/Throat: Uvula is midline, oropharynx is clear and moist and mucous membranes are normal.   Eyes: Conjunctivae and EOM are normal. Pupils are equal,  round, and reactive to light. No scleral icterus.   Neck: Normal range of motion and full passive range of motion without pain. Neck supple. No spinous process tenderness and no muscular tenderness present. No Brudzinski's sign and no Kernig's sign noted. No thyromegaly present.   Cardiovascular: Normal rate, regular rhythm, S1 normal, S2 normal, normal heart sounds and intact distal pulses.  Exam reveals no gallop and no friction rub.    No murmur heard.  Pulmonary/Chest: Effort normal and breath sounds normal. No respiratory distress. She has no decreased breath sounds. She has no wheezes. She has no rhonchi. She has no rales.   Abdominal: Soft. Normal appearance, normal aorta and bowel sounds are normal. She exhibits no distension, no abdominal bruit and no mass. There is no hepatosplenomegaly. There is no tenderness. There is no rigidity, no rebound, no guarding and no CVA  tenderness.   Musculoskeletal: Normal range of motion. She exhibits no edema and no tenderness.   Lymphadenopathy:     She has no cervical adenopathy.   Neurological: She is alert and oriented to person, place, and time. She has normal strength and normal reflexes. No cranial nerve deficit or sensory deficit. Coordination and gait normal.   No focal neurological deficits identified    Skin: Skin is warm, dry and intact. No cyanosis. Nails show no clubbing.   Psychiatric: She has a normal mood and affect. Her speech is normal and behavior is normal.        MDM     Differential Diagnosis; Clinical Impression; Plan:     Pt. States improvement after being medicated.  Amount and/or Complexity of Data Reviewed:   Clinical lab tests:  Ordered and reviewed  Tests in the radiology section of CPT??:  Ordered and reviewed   Discuss the patient with another provider:  No  Risk of Significant Complications, Morbidity, and/or Mortality:   Presenting problems:  Low  Diagnostic procedures:  Low  Management options:  Low  Progress:   Patient progress:  Improved and stable      Procedures    Recent Results (from the past 12 hour(s))   URINE MICROSCOPIC    Collection Time     11/16/12  6:00 PM       Result Value Range    WBC 20-50  0 /hpf    RBC 0-3  0 /hpf    Epithelial cells 10-20  0 /hpf    Bacteria 1+ (*) 0 /hpf    Casts 0-3  0 /lpf   HCG URINE, QL. - POC    Collection Time     11/16/12  6:56 PM       Result Value Range    Pregnancy test,urine (POC) NEGATIVE   NEG     CBC WITH AUTOMATED DIFF    Collection Time     11/16/12  8:10 PM       Result Value Range    WBC 8.7  4.3 - 11.1 K/uL    RBC 4.91  4.05 - 5.25 M/uL    HGB 15.0  11.7 - 15.4 g/dL    HCT 16.1  09.6 - 04.5 %    MCV 88.2  79.6 - 97.8 FL    MCH 30.5  26.1 - 32.9 PG    MCHC 34.6  31.4 - 35.0 g/dL    RDW 40.9  81.1 - 91.4 %    PLATELET 223  150 - 450 K/uL    MPV 10.8  10.8 - 14.1 FL  DF AUTOMATED      NEUTROPHILS 64  43 - 78 %    LYMPHOCYTES 26  13 - 44 %    MONOCYTES 7   4.0 - 12.0 %    EOSINOPHILS 3  0.5 - 7.8 %    BASOPHILS 0  0.0 - 2.0 %    IMMATURE GRANULOCYTES 0.2  0.0 - 5.0 %    ABS. NEUTROPHILS 5.5  1.7 - 8.2 K/UL    ABS. LYMPHOCYTES 2.2  0.5 - 4.6 K/UL    ABS. MONOCYTES 0.6  0.1 - 1.3 K/UL    ABS. EOSINOPHILS 0.2  0.0 - 0.8 K/UL    ABS. BASOPHILS 0.0  0.0 - 0.2 K/UL    ABS. IMM. GRANS. 0.0  0.0 - 0.5 K/UL   SED RATE, AUTOMATED    Collection Time     11/16/12  8:10 PM       Result Value Range    Sed rate, automated 19  0 - 20 mm/hr     CT HEAD WO CONT    Final Result: IMPRESSION: No acute findings          I discussed the results of all labs, procedures, radiographs, and treatments with the patient and available family. Treatment plan is agreed upon and the patient is ready for discharge. All voiced understanding of the discharge plan and medication instructions or changes as appropriate. Questions about treatment in the ED were answered. All were encouraged to return should symptoms worsen or new problems develop.

## 2012-11-16 NOTE — ED Notes (Signed)
Practioner discussing test results with patient.

## 2012-11-16 NOTE — ED Notes (Signed)
Pt st headache on right of head. Pt st blurry vision. Pt st has taken medication with minimal relief.

## 2012-11-17 LAB — CBC WITH AUTOMATED DIFF
ABS. BASOPHILS: 0 10*3/uL (ref 0.0–0.2)
ABS. EOSINOPHILS: 0.2 10*3/uL (ref 0.0–0.8)
ABS. IMM. GRANS.: 0 10*3/uL (ref 0.0–0.5)
ABS. LYMPHOCYTES: 2.2 10*3/uL (ref 0.5–4.6)
ABS. MONOCYTES: 0.6 10*3/uL (ref 0.1–1.3)
ABS. NEUTROPHILS: 5.5 10*3/uL (ref 1.7–8.2)
BASOPHILS: 0 % (ref 0.0–2.0)
EOSINOPHILS: 3 % (ref 0.5–7.8)
HCT: 43.3 % (ref 35.8–46.3)
HGB: 15 g/dL (ref 11.7–15.4)
IMMATURE GRANULOCYTES: 0.2 % (ref 0.0–5.0)
LYMPHOCYTES: 26 % (ref 13–44)
MCH: 30.5 PG (ref 26.1–32.9)
MCHC: 34.6 g/dL (ref 31.4–35.0)
MCV: 88.2 FL (ref 79.6–97.8)
MONOCYTES: 7 % (ref 4.0–12.0)
MPV: 10.8 FL (ref 10.8–14.1)
NEUTROPHILS: 64 % (ref 43–78)
PLATELET: 223 10*3/uL (ref 150–450)
RBC: 4.91 M/uL (ref 4.05–5.25)
RDW: 13.8 % (ref 11.9–14.6)
WBC: 8.7 10*3/uL (ref 4.3–11.1)

## 2012-11-17 LAB — SED RATE, AUTOMATED: Sed rate, automated: 19 mm/hr (ref 0–20)

## 2012-11-17 MED ADMIN — ondansetron (ZOFRAN ODT) tablet 8 mg: ORAL | NDC 68462015840

## 2012-11-17 MED ADMIN — methylPREDNISolone (PF) (SOLU-MEDROL) injection 125 mg: INTRAMUSCULAR | NDC 00009004725

## 2013-03-23 NOTE — ED Provider Notes (Signed)
HPI Comments: Pt also with rt knee pain for 2 months, no injury, worse at night, little relief to knee and wrist with tylenol no pmd     Patient is a 43 y.o. female presenting with wrist pain. The history is provided by the patient.   Wrist Pain   This is a chronic problem. Episode onset: 2 months  The problem occurs constantly. The problem has not changed since onset.The pain is present in the right wrist. The quality of the pain is described as aching. The pain is at a severity of 10/10. The pain is mild. Associated symptoms include stiffness. Pertinent negatives include no numbness and no tingling. The symptoms are aggravated by activity. Treatments tried: tylenol  The treatment provided mild relief. There has been no history of extremity trauma.        History reviewed. No pertinent past medical history.     Past Surgical History   Procedure Laterality Date   ??? Hx orthopaedic       right arm, toe         History reviewed. No pertinent family history.     History     Social History   ??? Marital Status: SINGLE     Spouse Name: N/A     Number of Children: N/A   ??? Years of Education: N/A     Occupational History   ??? Not on file.     Social History Main Topics   ??? Smoking status: Current Every Day Smoker -- 0.50 packs/day   ??? Smokeless tobacco: Never Used   ??? Alcohol Use: No      Comment: occasional   ??? Drug Use: No   ??? Sexually Active: Yes     Birth Control/ Protection: None     Other Topics Concern   ??? Not on file     Social History Narrative   ??? No narrative on file                  ALLERGIES: Review of patient's allergies indicates no known allergies.      Review of Systems   Musculoskeletal: Positive for stiffness.   Neurological: Negative for tingling and numbness.   All other systems reviewed and are negative.        Filed Vitals:    03/23/13 1125   BP: 125/71   Pulse: 74   Temp: 98.8 ??F (37.1 ??C)   Resp: 16   Height: 5\' 6"  (1.676 m)   Weight: 56.7 kg (125 lb)   SpO2: 100%            Physical Exam   Nursing  note and vitals reviewed.  Constitutional: She is oriented to person, place, and time. She appears well-developed and well-nourished. No distress.   HENT:   Head: Normocephalic and atraumatic.   Eyes: Conjunctivae and EOM are normal. Pupils are equal, round, and reactive to light.   Neck: Normal range of motion. Neck supple.   Cardiovascular: Normal rate and regular rhythm.    Pulmonary/Chest: Effort normal and breath sounds normal. No respiratory distress. She has no wheezes.   Abdominal: Soft. Bowel sounds are normal.   Musculoskeletal: She exhibits tenderness. She exhibits no edema.   Rt wrist w/o swelling or redness, pain to base of thumb to palpation, mild Finklestein's test, rt knee w/o redness or swelling, full motion, no crepitus    Neurological: She is alert and oriented to person, place, and time.   Skin: Skin is warm.  MDM     Differential Diagnosis; Clinical Impression; Plan:     Wrist tendonitis, mild knee djd, will give naprosyn and prednisone   Amount and/or Complexity of Data Reviewed:    Review and summarize past medical records:  Yes  Risk of Significant Complications, Morbidity, and/or Mortality:   Presenting problems:  Low  Diagnostic procedures:  Low  Management options:  Low  Progress:   Patient progress:  Improved and stable      Procedures

## 2013-03-23 NOTE — ED Notes (Signed)
Pt states she has right wrist and knee pain for at least a month. Pt denies injury

## 2013-03-23 NOTE — ED Notes (Signed)
Pt discharged home with rx x2 and instructions to follow up with PCP. Pt verbalized understanding of all instructions and all questions answered prior to discharge. All belongings taken with pt. Pt ambulatory out of department w/o difficulty.

## 2013-03-23 NOTE — ED Provider Notes (Signed)
I was physically present and available for consultation during this encounter.

## 2013-06-10 MED ORDER — ONDANSETRON 4 MG TAB, RAPID DISSOLVE
4 mg | ORAL_TABLET | Freq: Three times a day (TID) | ORAL | Status: DC | PRN
Start: 2013-06-10 — End: 2013-12-02

## 2013-06-10 NOTE — ED Notes (Signed)
I have reviewed medications, follow up provider options, and discharge instructions with the patient. The patient verbalized understanding. Copy of discharge information given to patient upon discharge. Prescription(s) given to patient. Patient discharged in no distress.
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
## 2013-12-02 NOTE — ED Notes (Signed)
I have reviewed discharge instructions with the patient.  The patient verbalized understanding.

## 2013-12-03 MED ORDER — MELOXICAM 7.5 MG TAB
7.5 mg | ORAL_TABLET | Freq: Every day | ORAL | Status: AC
Start: 2013-12-03 — End: 2013-12-16

## 2014-05-11 ENCOUNTER — Inpatient Hospital Stay
Admit: 2014-05-11 | Discharge: 2014-05-12 | Disposition: A | Discharge status: Home / Self Care | Payer: Self-pay | Attending: Emergency Medicine

## 2014-05-11 DIAGNOSIS — G5601 Carpal tunnel syndrome, right upper limb: Secondary | ICD-10-CM

## 2014-05-11 MED ORDER — KETOROLAC TROMETHAMINE 60 MG/2 ML IM
60 mg/2 mL | INTRAMUSCULAR | Status: AC
Start: 2014-05-11 — End: 2014-05-11
  Administered 2014-05-12: via INTRAMUSCULAR

## 2014-05-11 MED ORDER — DICLOFENAC 1 % TOPICAL GEL
1 % | Freq: Four times a day (QID) | CUTANEOUS | Status: AC
Start: 2014-05-11 — End: 2014-06-01

## 2014-05-11 MED ORDER — PREDNISONE 20 MG TAB
20 mg | ORAL_TABLET | Freq: Every day | ORAL | Status: AC
Start: 2014-05-11 — End: 2014-05-18

## 2014-05-11 MED ORDER — DEXAMETHASONE SODIUM PHOSPHATE 10 MG/ML IJ SOLN
10 mg/mL | INTRAMUSCULAR | Status: AC
Start: 2014-05-11 — End: 2014-05-11
  Administered 2014-05-12: via INTRAMUSCULAR

## 2014-05-11 MED FILL — KETOROLAC TROMETHAMINE 60 MG/2 ML IM: 60 mg/2 mL | INTRAMUSCULAR | Qty: 2

## 2014-05-11 MED FILL — DEXAMETHASONE SODIUM PHOSPHATE 10 MG/ML IJ SOLN: 10 mg/mL | INTRAMUSCULAR | Qty: 1

## 2014-05-11 NOTE — ED Notes (Signed)
CHARGE- VELCRO SPLINT RIGHT

## 2014-05-11 NOTE — ED Provider Notes (Signed)
HPI Comments: 44 y/o f to ed with complaint of pain in right thumb, first finger, wrist and up her arm since last novemnber.  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 She has tried splints and steroids but these efforts only help for a short time.  She admits burning and pain worse at night.  Has been told she has carpal tunnel syndrome and has been referred for therapy by [facility], but not for a specialist to evaluate.    Patient is a 44 y.o. female presenting with hand pain. The history is provided by the patient. No language interpreter was used.   Hand Pain   This is a chronic problem. The current episode started more than 1 week ago. The problem has been gradually worsening. The pain is present in the right wrist and right arm. The quality of the pain is described as sharp. The pain is moderate. Associated symptoms include numbness and stiffness. Pertinent negatives include no neck pain. There has been no history of extremity trauma.        History reviewed. No pertinent past medical history.    Past Surgical History:   Procedure Laterality Date   ??? Hx orthopaedic       right arm, toe         History reviewed. No pertinent family history.    History     Social History   ??? Marital Status: SINGLE     Spouse Name: N/A   ??? Number of Children: N/A   ??? Years of Education: N/A     Occupational History   ??? Not on file.     Social History Main Topics   ??? Smoking status: Current Every Day Smoker -- 0.50 packs/day   ??? Smokeless tobacco: Never Used   ??? Alcohol Use: Yes      Comment: 2 beers daily   ??? Drug Use: No   ??? Sexual Activity: Yes     Birth Control/ Protection: None     Other Topics Concern   ??? Not on file     Social History Narrative           ALLERGIES: Review of patient's allergies indicates no known allergies.      Review of Systems   Constitutional: Negative for fever and chills.   HENT: Negative for ear pain and hearing loss.    Eyes: Negative for discharge and itching.    Respiratory: Negative for cough and shortness of breath.    Cardiovascular: Negative for chest pain and palpitations.   Gastrointestinal: Negative for nausea, vomiting and diarrhea.   Endocrine: Negative for cold intolerance and heat intolerance.   Genitourinary: Negative for dysuria and difficulty urinating.   Musculoskeletal: Positive for myalgias and stiffness. Negative for neck pain and neck stiffness.   Skin: Negative for pallor and rash.   Neurological: Positive for weakness (right hand) and numbness. Negative for dizziness, seizures, facial asymmetry, light-headedness and headaches.   Psychiatric/Behavioral: Negative for confusion and decreased concentration.       Filed Vitals:    05/11/14 1738   BP: 128/72   Pulse: 86   Temp: 98.8 ??F (37.1 ??C)   Resp: 20   SpO2: 100%            Physical Exam   Constitutional: She is oriented to person, place, and time. She appears well-developed and well-nourished. No distress.   HENT:   Head: Normocephalic and atraumatic.   Right Ear: External ear normal.  Left Ear: External ear normal.   Nose: Nose normal.   Eyes: Conjunctivae and EOM are normal. Pupils are equal, round, and reactive to light.   Neck: Normal range of motion. Neck supple.   Cardiovascular: Normal rate, regular rhythm and normal heart sounds.    Pulmonary/Chest: Effort normal and breath sounds normal. No respiratory distress. She has no wheezes.   Abdominal: Soft. Bowel sounds are normal. She exhibits no distension. There is no tenderness.   Musculoskeletal: Normal range of motion. She exhibits tenderness. She exhibits no edema.        Arms:  Neurological: She is alert and oriented to person, place, and time. No cranial nerve deficit. Coordination normal.   Skin: Skin is warm and dry. No rash noted.   Psychiatric: She has a normal mood and affect. Her behavior is normal. Judgment and thought content normal.   Nursing note and vitals reviewed.       MDM  Number of Diagnoses or Management Options   Diagnosis management comments: [id number] y/o f with classic carpal tunnel syndrome right hand/wrist.  Will provide anti inflamm rx and injection as well as steroids, splint, follow with ortho or neuro of her choice    Risk of Complications, Morbidity, and/or Mortality  Presenting problems: minimal  Diagnostic procedures: minimal  Management options: low    Patient Progress  Patient progress: stable      Procedures

## 2014-05-11 NOTE — ED Notes (Signed)
PMD-None. C/o right hand pain since November 2015.

## 2014-05-26 ENCOUNTER — Inpatient Hospital Stay
Admit: 2014-05-26 | Discharge: 2014-05-26 | Disposition: A | Discharge status: Home / Self Care | Payer: Self-pay | Attending: Emergency Medicine

## 2014-05-26 ENCOUNTER — Emergency Department: Admit: 2014-05-26 | Payer: Self-pay | Primary: Family

## 2014-05-26 DIAGNOSIS — J209 Acute bronchitis, unspecified: Secondary | ICD-10-CM

## 2014-05-26 LAB — INFLUENZA A & B AG (RAPID TEST)
Influenza A Ag: NEGATIVE
Influenza B Ag: NEGATIVE

## 2014-05-26 MED ORDER — IBUPROFEN 800 MG TAB
800 mg | ORAL_TABLET | Freq: Three times a day (TID) | ORAL | Status: DC | PRN
Start: 2014-05-26 — End: 2014-12-20

## 2014-05-26 MED ORDER — ALBUTEROL SULFATE HFA 90 MCG/ACTUATION AEROSOL INHALER
90 mcg/actuation | Freq: Four times a day (QID) | RESPIRATORY_TRACT | Status: DC | PRN
Start: 2014-05-26 — End: 2014-12-20

## 2014-05-26 MED ORDER — IBUPROFEN 800 MG TAB
800 mg | ORAL | Status: AC
Start: 2014-05-26 — End: 2014-05-26
  Administered 2014-05-26: 16:00:00 via ORAL

## 2014-05-26 MED ORDER — AZITHROMYCIN 250 MG TAB
250 mg | ORAL_TABLET | ORAL | Status: AC
Start: 2014-05-26 — End: 2014-05-31

## 2014-05-26 MED FILL — IBUPROFEN 800 MG TAB: 800 mg | ORAL | Qty: 1

## 2014-05-26 NOTE — ED Notes (Signed)
I have reviewed discharge instructions with the patient.  The patient verbalized understanding.

## 2014-05-26 NOTE — ED Notes (Signed)
Patient medicated for fever in triage.

## 2014-05-26 NOTE — Progress Notes (Signed)
Visited with patient as no PCP listed in chart.  Patient states does not have a PCP and also has no insurance.  I explained the [facility] programs.  Patient is interested in being referred.  Notified if eligible someone would be calling in the next few days.  Email to [facility] Lindsay SW and referral made.   Brandy PittsMaria Kailie Polus RN Case Manager

## 2014-05-26 NOTE — ED Provider Notes (Signed)
HPI Comments: 44 year old African-American female presents stating that for the last 2 days she has had fever, chills, sweats especially at night with lightheadedness and body aches headache and dry cough.  She states that she smokes half pack per day of cigarettes.  She states she is followed by [facility].  Denies sore throat or vomiting.  She states she has had some nausea.  Denies abdominal pain or chest pain.  Denies hemoptysis or inspiratory pain.    Patient is a 44 y.o. female presenting with fever. The history is provided by the patient.   Fever   This is a new problem. The current episode started 2 days ago. The problem occurs constantly. The problem has not changed since onset.The maximum temperature noted was 102 - 102.9 F. The temperature was taken using an oral thermometer. Associated symptoms include congestion, headaches, muscle aches and cough. Pertinent negatives include no chest pain, no diarrhea, no vomiting, no sore throat, no shortness of breath, no neck pain, no rash and no urinary symptoms. She has tried nothing for the symptoms.        History reviewed. No pertinent past medical history.    Past Surgical History:   Procedure Laterality Date   ??? Hx orthopaedic       right arm, toe         History reviewed. No pertinent family history.    History     Social History   ??? Marital Status: SINGLE     Spouse Name: N/A   ??? Number of Children: N/A   ??? Years of Education: N/A     Occupational History   ??? Not on file.     Social History Main Topics   ??? Smoking status: Current Every Day Smoker -- 0.50 packs/day   ??? Smokeless tobacco: Never Used   ??? Alcohol Use: Yes      Comment: 2 beers daily   ??? Drug Use: No   ??? Sexual Activity: Yes     Birth Control/ Protection: None     Other Topics Concern   ??? Not on file     Social History Narrative           ALLERGIES: Review of patient's allergies indicates no known allergies.      Review of Systems    Constitutional: Positive for fever, chills, activity change and fatigue. Negative for diaphoresis.   HENT: Positive for congestion. Negative for ear pain, facial swelling, postnasal drip, rhinorrhea, sinus pressure, sneezing, sore throat, tinnitus, trouble swallowing and voice change.    Respiratory: Positive for cough and wheezing. Negative for chest tightness and shortness of breath.    Cardiovascular: Negative for chest pain and palpitations.   Gastrointestinal: Positive for nausea. Negative for vomiting, abdominal pain and diarrhea.   Genitourinary: Negative.  Negative for difficulty urinating.   Musculoskeletal: Positive for myalgias. Negative for neck pain and neck stiffness.   Skin: Negative.  Negative for rash.   Neurological: Positive for weakness, light-headedness and headaches. Negative for dizziness.   All other systems reviewed and are negative.      Filed Vitals:    05/26/14 1218   BP: 111/65   Pulse: 96   Temp: 102.8 ??F (39.3 ??C)   Resp: 20   Height:  (1.676 m)   Weight: 53.524 kg (118 lb)   SpO2: 97%            Physical Exam   Constitutional: She is oriented to person, place, and time.  Vital signs are normal. She appears well-developed and well-nourished. She is active.  Non-toxic appearance. She does not appear ill. No distress.   Patient lying on exam stretcher and appears uncomfortable.   HENT:   Head: Normocephalic and atraumatic.   Right Ear: Tympanic membrane normal.   Left Ear: Tympanic membrane normal.   Nose: Nose normal.   Mouth/Throat: Uvula is midline and mucous membranes are normal. Posterior oropharyngeal erythema present. No oropharyngeal exudate, posterior oropharyngeal edema or tonsillar abscesses.   Eyes: Conjunctivae and EOM are normal. Pupils are equal, round, and reactive to light.   Neck: Normal range of motion. Neck supple. No spinous process tenderness and no muscular tenderness present. No Brudzinski's sign and no Kernig's sign noted.    Cardiovascular: Normal rate, regular rhythm, normal heart sounds and intact distal pulses.  Exam reveals no gallop and no friction rub.    No murmur heard.  Pulmonary/Chest: Effort normal and breath sounds normal. No respiratory distress. She has no wheezes. She has no rales.   Abdominal: Soft. Bowel sounds are normal. She exhibits no distension. There is no tenderness.   Musculoskeletal: Normal range of motion. She exhibits no edema or tenderness.   Lymphadenopathy:     She has no cervical adenopathy.   Neurological: She is alert and oriented to person, place, and time. She has normal strength and normal reflexes. No sensory deficit. Coordination and gait normal.   Pt. Ambulatory and moving about on exam table and in exam room without difficulty. No focal neurological deficits identified.   Skin: Skin is warm, dry and intact.   Psychiatric: She has a normal mood and affect. Her speech is normal and behavior is normal.   Nursing note and vitals reviewed.       MDM  Number of Diagnoses or Management Options  Acute bronchitis, unspecified organism:   Flu-like symptoms:   Tobacco abuse:   Diagnosis management comments: Discussed over-the-counter symptomatic treatment for her symptoms.  Patient also received smoking cessation counseling.  She was prescribed an antibiotic as well as an inhaler with a spacer.       Amount and/or Complexity of Data Reviewed  Clinical lab tests: ordered and reviewed  Tests in the radiology section of CPT??: ordered and reviewed  Discuss the patient with other providers: yes    Risk of Complications, Morbidity, and/or Mortality  Presenting problems: low  Diagnostic procedures: low  Management options: low    Patient Progress  Patient progress: stable      Procedures      Recent Results (from the past 84 hour(s))   INFLUENZA A & B AG (RAPID TEST)    Collection Time: 05/26/14  1:40 PM   Result Value Ref Range    Influenza A Ag NEGATIVE  NEG      Influenza B Ag NEGATIVE  NEG          CXR: No acute disease.    I discussed the results of all labs, procedures, radiographs, and treatments with the patient and available family. Treatment plan is agreed upon and the patient is ready for discharge. All voiced understanding of the discharge plan and medication instructions or changes as appropriate. Questions about treatment in the ED were answered. All were encouraged to return should symptoms worsen or new problems develop.

## 2014-05-26 NOTE — ED Notes (Addendum)
Patient c/o fever, chills, generalized body aches, headache and cough x 2 days.

## 2014-05-27 NOTE — Progress Notes (Signed)
Patient was called regarding [facility] referral. Patient states he attends [employer] and his doctor is Dr. Leotis ShamesLauren Spanx.

## 2014-10-22 ENCOUNTER — Emergency Department: Admit: 2014-10-22 | Payer: Self-pay | Primary: Family

## 2014-10-22 ENCOUNTER — Inpatient Hospital Stay
Admit: 2014-10-22 | Discharge: 2014-10-22 | Disposition: A | Discharge status: Home / Self Care | Payer: Self-pay | Attending: Emergency Medicine

## 2014-10-22 DIAGNOSIS — T148 Other injury of unspecified body region: Secondary | ICD-10-CM

## 2014-10-22 LAB — HCG URINE, QL. - POC: Pregnancy test,urine (POC): NEGATIVE

## 2014-10-22 MED ORDER — LIDOCAINE 4 % TOPICAL CREAM
4 % | Freq: Three times a day (TID) | CUTANEOUS | 0 refills | Status: DC | PRN
Start: 2014-10-22 — End: 2014-12-20

## 2014-10-22 MED ORDER — TRAMADOL 50 MG TAB
50 mg | ORAL_TABLET | Freq: Four times a day (QID) | ORAL | 0 refills | Status: DC | PRN
Start: 2014-10-22 — End: 2014-12-20

## 2014-10-22 MED ORDER — DIPHTH,PERTUS(AC)TETANUS VAC(PF) 2.5 LF UNIT-8 MCG-5 LF/0.5 ML INJ
INTRAMUSCULAR | Status: AC
Start: 2014-10-22 — End: 2014-10-22
  Administered 2014-10-22: 16:00:00 via INTRAMUSCULAR

## 2014-10-22 MED ORDER — NAPROXEN 250 MG TAB
250 mg | ORAL | Status: AC
Start: 2014-10-22 — End: 2014-10-22
  Administered 2014-10-22: 15:00:00 via ORAL

## 2014-10-22 MED ORDER — NAPROXEN 375 MG TAB
375 mg | ORAL_TABLET | Freq: Two times a day (BID) | ORAL | 0 refills | Status: DC
Start: 2014-10-22 — End: 2014-12-20

## 2014-10-22 MED FILL — NAPROXEN 250 MG TAB: 250 mg | ORAL | Qty: 2

## 2014-10-22 MED FILL — BOOSTRIX TDAP 2.5 LF UNIT-8 MCG-5 LF/0.5 ML INTRAMUSCULAR SUSPENSION: INTRAMUSCULAR | Qty: 1

## 2014-10-22 NOTE — ED Provider Notes (Addendum)
HPI Comments: 44 y/o person to ed after fall on 5 days ago.  Was working on flat bed truck and missed step, fell approx four feet to concrete.  Landed on right hip, immed pain afterwards. Has been having continued pain and limp since.  Pain into sacrum as well as behind right knee.  Few scattered abrasions as well to right leg.  ? tet    Patient is a 44 y.o. female presenting with fall. The history is provided by the patient. No language interpreter was used.   Fall   The accident occurred more than 2 days ago (5 days ago). Fall occurred: from flat bed truck. She fell from a height of 3 - 5 ft. She landed on concrete. The point of impact was the right hip and right knee. The pain is moderate. She was ambulatory at the scene. Pertinent negatives include no visual change, no fever, no numbness, no abdominal pain, no bowel incontinence, no nausea, no vomiting, no hematuria, no headaches, no extremity weakness, no hearing loss, no loss of consciousness, no tingling and no laceration. It is unknown when the patient last had a tetanus shot.        History reviewed. No pertinent past medical history.    Past Surgical History:   Procedure Laterality Date   ??? Hx orthopaedic       right arm, toe         History reviewed. No pertinent family history.    Social History     Social History   ??? Marital status: SINGLE     Spouse name: N/A   ??? Number of children: N/A   ??? Years of education: N/A     Occupational History   ??? Not on file.     Social History Main Topics   ??? Smoking status: Current Every Day Smoker     Packs/day: 0.50   ??? Smokeless tobacco: Never Used   ??? Alcohol use Yes      Comment: 2 beers daily   ??? Drug use: No   ??? Sexual activity: Yes     Birth control/ protection: None     Other Topics Concern   ??? Not on file     Social History Narrative         ALLERGIES: Review of patient's allergies indicates no known allergies.    Review of Systems   Constitutional: Negative for appetite change and fever.    HENT: Negative for drooling and ear pain.    Eyes: Negative for discharge and redness.   Respiratory: Negative for cough and shortness of breath.    Cardiovascular: Negative for chest pain and palpitations.   Gastrointestinal: Negative for abdominal pain, bowel incontinence, nausea and vomiting.   Endocrine: Negative for cold intolerance and heat intolerance.   Genitourinary: Negative for difficulty urinating, dysuria and hematuria.   Musculoskeletal: Positive for arthralgias and gait problem. Negative for extremity weakness, neck pain and neck stiffness.   Skin: Negative for pallor and wound.   Neurological: Negative for dizziness, tingling, loss of consciousness, numbness and headaches.   Psychiatric/Behavioral: Negative for confusion and decreased concentration.       Vitals:    10/22/14 1006   BP: 128/73   Pulse: 80   Resp: 16   Temp: 98.2 ??F (36.8 ??C)   SpO2: 99%   Weight: 54.4 kg (120 lb)   Height: 5\' 3"  (1.6 m)            Physical Exam  Constitutional: She is oriented to person, place, and time. She appears well-developed and well-nourished. No distress.   HENT:   Head: Normocephalic and atraumatic.   Right Ear: External ear normal.   Left Ear: External ear normal.   Nose: Nose normal.   Eyes: Conjunctivae and EOM are normal. Pupils are equal, round, and reactive to light.   Neck: Normal range of motion. Neck supple.   Cardiovascular: Normal rate, regular rhythm and normal heart sounds.    Pulmonary/Chest: Effort normal and breath sounds normal. No respiratory distress. She has no wheezes.   Abdominal: Soft. Bowel sounds are normal. She exhibits no distension. There is no tenderness.   Musculoskeletal: Normal range of motion. She exhibits no edema or tenderness.        Legs:  Neurological: She is alert and oriented to person, place, and time. No cranial nerve deficit. Coordination normal.   Skin: Skin is warm and dry. No laceration and no rash noted.    Psychiatric: She has a normal mood and affect. Her behavior is normal. Judgment and thought content normal.   Nursing note and vitals reviewed.       MDM  Number of Diagnoses or Management Options  Diagnosis management comments: 44 y/o f to ed after fall 5 days ago. Has not gotten better and still having moderate pain.  Will eval xrays for acute, provide pain control and update tet.   lmp Ended on august 8th     Last tet unknown  12:00 PM  xrays are neg.  Will dc home with pain control.       Amount and/or Complexity of Data Reviewed  Tests in the radiology section of CPT??: ordered and reviewed    Risk of Complications, Morbidity, and/or Mortality  Presenting problems: minimal  Diagnostic procedures: minimal  Management options: minimal    Patient Progress  Patient progress: stable    ED Course       Procedures

## 2014-10-22 NOTE — Progress Notes (Signed)
Patient referred a second time to [facility] by Lawernce PittsMaria Grier, [facility]. Called patient at 12:34 pm today regarding referral. Had to leave a message.

## 2014-10-22 NOTE — ED Notes (Signed)
Pt back from x-ray

## 2014-10-22 NOTE — ED Notes (Signed)
pt with fall on 8/15.  Complains of right leg and right hip pain.  Able to ambulate to room

## 2014-10-22 NOTE — ED Notes (Cosign Needed)
I have reviewed discharge instructions with the patient. Printed instructions, follow-up information and prescriptions were given. The patient verbalized understanding. Pt left ambulatory in no acute distress.

## 2014-12-20 ENCOUNTER — Emergency Department: Payer: Self-pay | Primary: Family

## 2014-12-20 ENCOUNTER — Inpatient Hospital Stay: Admit: 2014-12-20 | Discharge: 2014-12-20 | Payer: Self-pay | Attending: Emergency Medicine

## 2014-12-20 NOTE — ED Triage Notes (Signed)
Congested cough times one week ago.

## 2016-03-28 ENCOUNTER — Inpatient Hospital Stay
Admit: 2016-03-28 | Discharge: 2016-03-28 | Disposition: A | Discharge status: Home / Self Care | Payer: Self-pay | Attending: Emergency Medicine

## 2016-03-28 NOTE — ED Triage Notes (Signed)
Pt reports she has been having lower abdominal pain since Monday. Pt denies n/v/d. Pt reports she noticed small amount of blood in stool yesterday. Pt also reports rash to back.    Alene MiresAmanda M Kandise Riehle, RN

## 2016-03-28 NOTE — ED Notes (Signed)
Pt not in lobby when called to room.

## 2016-12-17 ENCOUNTER — Inpatient Hospital Stay
Admit: 2016-12-17 | Discharge: 2016-12-17 | Disposition: A | Discharge status: Home / Self Care | Payer: PRIVATE HEALTH INSURANCE | Attending: Emergency Medicine

## 2016-12-17 ENCOUNTER — Emergency Department: Admit: 2016-12-17 | Payer: PRIVATE HEALTH INSURANCE | Primary: Family

## 2016-12-17 DIAGNOSIS — R062 Wheezing: Secondary | ICD-10-CM

## 2016-12-17 MED ORDER — ALBUTEROL SULFATE HFA 90 MCG/ACTUATION AEROSOL INHALER
90 mcg/actuation | RESPIRATORY_TRACT | 0 refills | Status: DC | PRN
Start: 2016-12-17 — End: 2017-01-16

## 2016-12-17 MED ORDER — LORATADINE 10 MG TAB
10 mg | ORAL_TABLET | Freq: Every day | ORAL | 0 refills | Status: AC
Start: 2016-12-17 — End: 2016-12-27

## 2016-12-17 MED ORDER — IPRATROPIUM-ALBUTEROL 2.5 MG-0.5 MG/3 ML NEB SOLUTION
2.5 mg-0.5 mg/3 ml | RESPIRATORY_TRACT | Status: AC
Start: 2016-12-17 — End: 2016-12-17
  Administered 2016-12-17: 13:00:00 via RESPIRATORY_TRACT

## 2016-12-17 MED ORDER — PREDNISONE 20 MG TAB
20 mg | ORAL_TABLET | Freq: Every day | ORAL | 0 refills | Status: AC
Start: 2016-12-17 — End: 2016-12-22

## 2016-12-17 MED FILL — IPRATROPIUM-ALBUTEROL 2.5 MG-0.5 MG/3 ML NEB SOLUTION: 2.5 mg-0.5 mg/3 ml | RESPIRATORY_TRACT | Qty: 3

## 2016-12-17 NOTE — ED Provider Notes (Addendum)
HPI Comments: Patient presents to the ER complaining of recurrent cough.  Reports for the past 2 weeks she's had episodes of significant coughing, runny nose, nasal congestion and chest tightness.  She relates her symptoms with "chemical exposure at work".  Reports her cough has been productive of clear sputum.  Denies any fevers, nausea or vomiting.  Does report tobacco use.    Patient is a 46 y.o. female presenting with cough. The history is provided by the patient.   Cough   This is a recurrent problem. The current episode started more than 1 week ago. The problem has not changed since onset.The cough is productive of sputum. There has been no fever. Associated symptoms include rhinorrhea, shortness of breath and wheezing. Pertinent negatives include no chills, no sweats, no eye redness, no ear congestion, no sore throat, no myalgias and no vomiting. She has tried nothing for the symptoms. She is a smoker.        History reviewed. No pertinent past medical history.    Past Surgical History:   Procedure Laterality Date   ??? HX ORTHOPAEDIC      right arm, toe         History reviewed. No pertinent family history.    Social History     Social History   ??? Marital status: SINGLE     Spouse name: N/A   ??? Number of children: N/A   ??? Years of education: N/A     Occupational History   ??? Not on file.     Social History Main Topics   ??? Smoking status: Current Every Day Smoker     Packs/day: 0.50   ??? Smokeless tobacco: Never Used   ??? Alcohol use Yes      Comment: 2 beers daily   ??? Drug use: No   ??? Sexual activity: Yes     Birth control/ protection: None     Other Topics Concern   ??? Not on file     Social History Narrative         ALLERGIES: Review of patient's allergies indicates no known allergies.    Review of Systems   Constitutional: Negative for chills, fatigue and fever.   HENT: Positive for rhinorrhea. Negative for sore throat.    Eyes: Negative for photophobia, redness and visual disturbance.    Respiratory: Positive for cough, chest tightness, shortness of breath and wheezing.    Cardiovascular: Negative for palpitations and leg swelling.   Gastrointestinal: Negative for abdominal pain and vomiting.   Endocrine: Negative for polydipsia, polyphagia and polyuria.   Genitourinary: Negative for flank pain, frequency and urgency.   Musculoskeletal: Negative for back pain and myalgias.   Neurological: Negative for light-headedness and numbness.   Hematological: Negative for adenopathy. Does not bruise/bleed easily.   Psychiatric/Behavioral: Negative for behavioral problems.   All other systems reviewed and are negative.      Vitals:    12/17/16 0834   BP: 135/88   Pulse: 69   Resp: 16   Temp: 98 ??F (36.7 ??C)   SpO2: 98%   Weight: 58.1 kg (128 lb 1.6 oz)   Height:  (1.651 m)            Physical Exam   Constitutional: She is oriented to person, place, and time. She appears well-developed and well-nourished.   HENT:   Head: Normocephalic and atraumatic.   Mouth/Throat: Oropharynx is clear and moist.   Eyes: Conjunctivae and EOM are normal. Pupils are  equal, round, and reactive to light. No scleral icterus.   Neck: Normal range of motion. Neck supple. No tracheal deviation present. No thyromegaly present.   Cardiovascular: Normal rate, regular rhythm and intact distal pulses.    Pulmonary/Chest: Effort normal. No respiratory distress. She has wheezes.   Abdominal: Soft. Bowel sounds are normal. She exhibits no distension. There is no tenderness.   Musculoskeletal: Normal range of motion. She exhibits no edema or deformity.   Neurological: She is alert and oriented to person, place, and time. She has normal reflexes. No cranial nerve deficit.   Nursing note and vitals reviewed.       MDM  Number of Diagnoses or Management Options  Recurrent cough:   Wheezing:   Diagnosis management comments: Patient does have some mild wheezing on exam.  We'll obtain chest x-ray, treated with nebs    9:21 AM   Chest x-ray is clear.  Wheezing resolved with the nebulizers.  Discussed with patient further care.  We'll discharge home with albuterol, loratadine as well as prednisone.  Encouraged follow-up PCP       Amount and/or Complexity of Data Reviewed  Tests in the radiology section of CPT??: ordered and reviewed    Risk of Complications, Morbidity, and/or Mortality  Presenting problems: moderate  Diagnostic procedures: low  Management options: low    Patient Progress  Patient progress: stable        ED Course       Procedures

## 2016-12-17 NOTE — ED Notes (Signed)
RT notified of Duo-neb order.

## 2016-12-17 NOTE — ED Triage Notes (Signed)
Pt to ED c/o shortness of breath, cough with clear sputum production, nasal drainage and cold symptoms. States she thinks it's the chemicals at work. States she feels as though she is wheezing but does not take inhalers/nebulizers. States seasonal allergies "in the spring."

## 2016-12-17 NOTE — ED Notes (Signed)
I have reviewed discharge instructions with the patient.  The patient verbalized understanding.    Patient left ED via Discharge Method: ambulatory to Home with self.    Opportunity for questions and clarification provided.       Patient given 3 scripts. Albuterol, claritin and prednisone. Instructed to push fluids, stop smoking and follow up with PCP. Pt given work excuse as requested.        To continue your aftercare when you leave the hospital, you may receive an automated call from our care team to check in on how you are doing.  This is a free service and part of our promise to provide the best care and service to meet your aftercare needs.??? If you have questions, or wish to unsubscribe from this service please call [phone].  Thank you for Choosing our [facility] Emergency Department.

## 2017-01-16 ENCOUNTER — Inpatient Hospital Stay
Admit: 2017-01-16 | Discharge: 2017-01-16 | Disposition: A | Discharge status: Home / Self Care | Payer: Self-pay | Attending: Emergency Medicine

## 2017-01-16 DIAGNOSIS — K047 Periapical abscess without sinus: Secondary | ICD-10-CM

## 2017-01-16 MED ORDER — AMOXICILLIN 400 MG/5 ML ORAL SUSP
400 mg/5 mL | Freq: Two times a day (BID) | ORAL | 0 refills | Status: AC
Start: 2017-01-16 — End: 2017-01-26

## 2017-01-16 MED ORDER — IBUPROFEN 600 MG TAB
600 mg | ORAL_TABLET | Freq: Three times a day (TID) | ORAL | 0 refills | Status: AC | PRN
Start: 2017-01-16 — End: 2017-01-23

## 2017-01-16 NOTE — ED Triage Notes (Signed)
Pt began to experience facial swelling on Monday and bumps last night and believes she is having an allergic reaction. Takes no prescription meds.

## 2017-01-16 NOTE — ED Notes (Signed)
I have reviewed discharge instructions with the patient.  The patient verbalized understanding.    Patient left ED via Discharge Method: ambulatory to Home with (self).    Opportunity for questions and clarification provided.       Patient given 2 scripts.         To continue your aftercare when you leave the hospital, you may receive an automated call from our care team to check in on how you are doing.  This is a free service and part of our promise to provide the best care and service to meet your aftercare needs.??? If you have questions, or wish to unsubscribe from this service please call [phone].  Thank you for Choosing our [place] Emergency Department.

## 2017-01-16 NOTE — ED Notes (Signed)
Gave report to Julianne, RN

## 2017-01-16 NOTE — ED Provider Notes (Signed)
Patient presents to the emergency department complaining of facial swelling starting late Sunday night, 2 days ago, with associated tingling and discomfort.  Swelling is primarily to the upper lip starting on the left and advancing to the right as well.  Patient did attempt one amoxicillin from a friend yesterday with no improvement.  She describes some chills but no fever.  She denies any new medications or exposures.  She denies any shortness of breath or difficulty swallowing.      The history is provided by the patient.   Dental Problem    This is a new problem. The current episode started 2 days ago. The problem occurs constantly. The problem has been gradually worsening. The pain is located in the left upper mouth. The pain is moderate. Associated symptoms include swelling.There was no vomiting, no nausea, no fever, no chest pain, no shortness of breath, no headaches, no gum redness and no drainage. She has tried acetaminophen and aspirin for the symptoms. The treatment provided no relief. The patient has no cardiac history.       History reviewed. No pertinent past medical history.    Past Surgical History:   Procedure Laterality Date   ??? HX ORTHOPAEDIC      right arm, toe         History reviewed. No pertinent family history.    Social History     Socioeconomic History   ??? Marital status: SINGLE     Spouse name: Not on file   ??? Number of children: Not on file   ??? Years of education: Not on file   ??? Highest education level: Not on file   Social Needs   ??? Financial resource strain: Not on file   ??? Food insecurity - worry: Not on file   ??? Food insecurity - inability: Not on file   ??? Transportation needs - medical: Not on file   ??? Transportation needs - non-medical: Not on file   Occupational History   ??? Not on file   Tobacco Use   ??? Smoking status: Current Every Day Smoker     Packs/day: 0.50   ??? Smokeless tobacco: Never Used   Substance and Sexual Activity   ??? Alcohol use: Yes     Comment: 2 beers daily    ??? Drug use: No   ??? Sexual activity: Yes     Birth control/protection: None   Other Topics Concern   ??? Not on file   Social History Narrative   ??? Not on file         ALLERGIES: Patient has no known allergies.    Review of Systems   Constitutional: Positive for chills. Negative for activity change, appetite change, diaphoresis, fatigue and fever.   HENT: Positive for dental problem and facial swelling. Negative for congestion and trouble swallowing.    All other systems reviewed and are negative.      Vitals:    01/16/17 0637   BP: 134/65   Pulse: 82   Resp: 18   Temp: 99 ??F (37.2 ??C)   SpO2: 98%   Weight: 58.1 kg (128 lb)   Height: 5\' 5"  (1.651 m)            Physical Exam   Constitutional: She is oriented to person, place, and time. She appears well-developed and well-nourished. No distress.   HENT:   Head: Normocephalic and atraumatic.   Right Ear: External ear normal.   Left Ear: External ear normal.  Mouth/Throat: Uvula is midline, oropharynx is clear and moist and mucous membranes are normal. Dental abscesses and dental caries present. No uvula swelling.       Eyes: Conjunctivae and EOM are normal. Pupils are equal, round, and reactive to light.   Neck: Normal range of motion. Neck supple.   Cardiovascular: Normal rate, regular rhythm, normal heart sounds and intact distal pulses.   Pulmonary/Chest: Effort normal and breath sounds normal.   Abdominal: Soft. Bowel sounds are normal. There is no tenderness.   Musculoskeletal: Normal range of motion. She exhibits no edema.   Neurological: She is alert and oriented to person, place, and time.   Skin: Skin is warm and dry. Capillary refill takes less than 2 seconds.   Psychiatric: She has a normal mood and affect.   Nursing note and vitals reviewed.       MDM  Number of Diagnoses or Management Options  Dental abscess: new and does not require workup     Amount and/or Complexity of Data Reviewed  Review and summarize past medical records: yes     Risk of Complications, Morbidity, and/or Mortality  Presenting problems: low  Diagnostic procedures: minimal  Management options: low    Patient Progress  Patient progress: stable         Procedures

## 2017-05-29 ENCOUNTER — Inpatient Hospital Stay
Admit: 2017-05-29 | Discharge: 2017-05-29 | Disposition: A | Discharge status: Home / Self Care | Payer: BLUE CROSS/BLUE SHIELD | Attending: Emergency Medicine

## 2017-05-29 DIAGNOSIS — H6122 Impacted cerumen, left ear: Secondary | ICD-10-CM

## 2017-05-29 MED ORDER — CARBAMIDE PEROXIDE 6.5 % EAR DROPS
6.5 % | Freq: Two times a day (BID) | OTIC | 0 refills | Status: DC
Start: 2017-05-29 — End: 2018-04-22

## 2017-05-29 MED ORDER — CARBAMIDE PEROXIDE 6.5 % EAR DROPS
6.5 % | Freq: Two times a day (BID) | OTIC | 0 refills | Status: DC
Start: 2017-05-29 — End: 2017-05-29

## 2017-05-29 MED ORDER — AMOXICILLIN 250 MG/5 ML ORAL SUSP
250 mg/5 mL | Freq: Three times a day (TID) | ORAL | 0 refills | Status: AC
Start: 2017-05-29 — End: 2017-06-05

## 2017-05-29 MED ORDER — AMOXICILLIN 500 MG TABLET
500 mg | ORAL_TABLET | Freq: Three times a day (TID) | ORAL | 0 refills | Status: DC
Start: 2017-05-29 — End: 2017-05-29

## 2017-05-29 NOTE — ED Notes (Signed)
I have reviewed discharge instructions with the patient.  The patient verbalized understanding.    Patient left ED via Discharge Method: ambulatory to Home with self  Opportunity for questions and clarification provided.       Patient given 2 scripts.         To continue your aftercare when you leave the hospital, you may receive an automated call from our care team to check in on how you are doing.  This is a free service and part of our promise to provide the best care and service to meet your aftercare needs.??? If you have questions, or wish to unsubscribe from this service please call [phone].  Thank you for Choosing our [facility].

## 2017-05-29 NOTE — ED Triage Notes (Signed)
Pt c/o not being able to hear well out of left ear. PT c/o pain to left ear when she tries to put her finger in her ear.

## 2017-05-29 NOTE — ED Provider Notes (Signed)
47 year old female presents for evaluation of decreased hearing in her left ear that has been occurring over a long time.  She is unable to state how long.  However last night it got worse and her significant other told her it was time to just go to the doctor.  She says that she has literally no hearing in the left ear and is become sore as well.  She's had no fever no chills no sore throat.  No drainage.  No cervical adenopathy.  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           History reviewed. No pertinent past medical history.    Past Surgical History:   Procedure Laterality Date   ??? HX ORTHOPAEDIC      right arm, toe         History reviewed. No pertinent family history.    Social History     Socioeconomic History   ??? Marital status: SINGLE     Spouse name: Not on file   ??? Number of children: Not on file   ??? Years of education: Not on file   ??? Highest education level: Not on file   Occupational History   ??? Not on file   Social Needs   ??? Financial resource strain: Not on file   ??? Food insecurity:     Worry: Not on file     Inability: Not on file   ??? Transportation needs:     Medical: Not on file     Non-medical: Not on file   Tobacco Use   ??? Smoking status: Current Every Day Smoker     Packs/day: 0.50   ??? Smokeless tobacco: Never Used   Substance and Sexual Activity   ??? Alcohol use: Yes     Comment: 2 beers daily   ??? Drug use: No   ??? Sexual activity: Yes     Birth control/protection: None   Lifestyle   ??? Physical activity:     Days per week: Not on file     Minutes per session: Not on file   ??? Stress: Not on file   Relationships   ??? Social connections:     Talks on phone: Not on file     Gets together: Not on file     Attends religious service: Not on file     Active member of club or organization: Not on file     Attends meetings of clubs or organizations: Not on file      Relationship status: Not on file   ??? Intimate partner violence:     Fear of current or ex partner: Not on file     Emotionally abused: Not on file     Physically abused: Not on file     Forced sexual activity: Not on file   Other Topics Concern   ??? Not on file   Social History Narrative   ??? Not on file         ALLERGIES: Patient has no known allergies.    Review of Systems   Constitutional: Negative for chills and fever.   HENT: Positive for ear pain and hearing loss. Negative for dental problem, ear discharge, facial swelling, postnasal drip and sore throat.    Eyes: Negative for discharge and redness.   Respiratory: Positive for wheezing. Negative for cough and  shortness of breath.    Cardiovascular: Negative for chest pain and palpitations.   Gastrointestinal: Negative for nausea and vomiting.   Genitourinary: Negative for difficulty urinating.   Musculoskeletal: Negative for back pain and myalgias.   Skin: Negative for color change and wound.   Neurological: Negative for seizures and facial asymmetry.   Psychiatric/Behavioral: Negative for confusion and decreased concentration.       Vitals:    05/29/17 1131   BP: 121/77   Pulse: 62   Resp: 16   Temp: 98.7 ??F (37.1 ??C)   SpO2: 100%   Weight: 60.1 kg (132 lb 8 oz)   Height: 5\' 5"  (1.651 m)            Physical Exam   Constitutional: She is oriented to person, place, and time. She appears well-developed and well-nourished.   HENT:   Head: Normocephalic and atraumatic.   Right Ear: There is mastoid tenderness.   Left Ear: No mastoid tenderness. Decreased hearing is noted.   Ears:    Nose: Nose normal.   Eyes: Pupils are equal, round, and reactive to light. EOM are normal.   Neck: Normal range of motion. Neck supple.   Cardiovascular: Normal rate and regular rhythm.   Pulmonary/Chest: Effort normal. No stridor. No respiratory distress. She has wheezes.   Musculoskeletal: Normal range of motion.   Lymphadenopathy:     She has no cervical adenopathy.    Neurological: She is alert and oriented to person, place, and time.   Skin: Skin is warm and dry. Capillary refill takes less than 2 seconds.   Psychiatric: She has a normal mood and affect. Her behavior is normal. Judgment and thought content normal.   Nursing note and vitals reviewed.       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  We will discharge home with debrox, as well w abx in case infection has increased her normal drainage  I have encouraged her to stop smoking    Risk of Complications, Morbidity, and/or Mortality  Presenting problems: minimal  Diagnostic procedures: minimal  Management options: minimal    Patient Progress  Patient progress: stable         Procedures

## 2018-04-22 ENCOUNTER — Emergency Department: Admit: 2018-04-22 | Payer: Self-pay | Primary: Family

## 2018-04-22 ENCOUNTER — Inpatient Hospital Stay
Admit: 2018-04-22 | Discharge: 2018-04-22 | Disposition: A | Discharge status: Home / Self Care | Payer: Self-pay | Attending: Emergency Medicine

## 2018-04-22 DIAGNOSIS — L03031 Cellulitis of right toe: Secondary | ICD-10-CM

## 2018-04-22 LAB — METABOLIC PANEL, BASIC
Anion gap: 6 mmol/L — ABNORMAL LOW (ref 7–16)
BUN: 11 MG/DL (ref 6–23)
CO2: 23 mmol/L (ref 21–32)
Calcium: 9.3 MG/DL (ref 8.3–10.4)
Chloride: 110 mmol/L — ABNORMAL HIGH (ref 98–107)
Creatinine: 1.01 MG/DL — ABNORMAL HIGH (ref 0.6–1.0)
GFR est AA: >60 mL/min/{1.73_m2} (ref >60)
GFR est non-AA: >60 mL/min/{1.73_m2} (ref >60)
Glucose: 99 mg/dL (ref 65–100)
Potassium: 4 mmol/L (ref 3.5–5.1)
Sodium: 139 mmol/L (ref 136–145)

## 2018-04-22 LAB — CBC WITH AUTOMATED DIFF
ABS. BASOPHILS: 0 10*3/uL (ref 0.0–0.2)
ABS. EOSINOPHILS: 0.2 10*3/uL (ref 0.0–0.8)
ABS. IMM. GRANS.: 0 10*3/uL (ref 0.0–0.5)
ABS. LYMPHOCYTES: 1.7 10*3/uL (ref 0.5–4.6)
ABS. MONOCYTES: 0.8 10*3/uL (ref 0.1–1.3)
ABS. NEUTROPHILS: 5.7 10*3/uL (ref 1.7–8.2)
ABSOLUTE NRBC: 0 10*3/uL (ref 0.0–0.2)
BASOPHILS: 1 % (ref 0.0–2.0)
EOSINOPHILS: 3 % (ref 0.5–7.8)
HCT: 43 % (ref 35.8–46.3)
HGB: 14.1 g/dL (ref 11.7–15.4)
IMMATURE GRANULOCYTES: 0 % (ref 0.0–5.0)
LYMPHOCYTES: 20 % (ref 13–44)
MCH: 30.3 PG (ref 26.1–32.9)
MCHC: 32.8 g/dL (ref 31.4–35.0)
MCV: 92.3 FL (ref 79.6–97.8)
MONOCYTES: 10 % (ref 4.0–12.0)
MPV: 9.8 FL (ref 9.4–12.3)
NEUTROPHILS: 67 % (ref 43–78)
PLATELET: 270 10*3/uL (ref 150–450)
RBC: 4.66 M/uL (ref 4.05–5.2)
RDW: 13.8 % (ref 11.9–14.6)
WBC: 8.5 10*3/uL (ref 4.3–11.1)

## 2018-04-22 LAB — URIC ACID
Uric Acid, Serum: 5.2 MG/DL (ref 2.6–6.0)
Uric acid: 5.2 MG/DL (ref 2.6–6.0)

## 2018-04-22 LAB — C REACTIVE PROTEIN, QT: C-Reactive protein: <0.3 mg/dL (ref 0.0–0.9)

## 2018-04-22 LAB — CBC WITH AUTO DIFFERENTIAL
Basophils %: 1 % (ref 0.0–2.0)
Basophils Absolute: 0 10*3/uL (ref 0.0–0.2)
Eosinophils %: 3 % (ref 0.5–7.8)
Eosinophils Absolute: 0.2 10*3/uL (ref 0.0–0.8)
Granulocyte Absolute Count: 0 10*3/uL (ref 0.0–0.5)
Hematocrit: 43 % (ref 35.8–46.3)
Hemoglobin: 14.1 g/dL (ref 11.7–15.4)
Immature Granulocytes: 0 % (ref 0.0–5.0)
Lymphocytes %: 20 % (ref 13–44)
Lymphocytes Absolute: 1.7 10*3/uL (ref 0.5–4.6)
MCH: 30.3 PG (ref 26.1–32.9)
MCHC: 32.8 g/dL (ref 31.4–35.0)
MCV: 92.3 FL (ref 79.6–97.8)
MPV: 9.8 FL (ref 9.4–12.3)
Monocytes %: 10 % (ref 4.0–12.0)
Monocytes Absolute: 0.8 10*3/uL (ref 0.1–1.3)
NRBC Absolute: 0 10*3/uL (ref 0.0–0.2)
Neutrophils %: 67 % (ref 43–78)
Neutrophils Absolute: 5.7 10*3/uL (ref 1.7–8.2)
Platelets: 270 10*3/uL (ref 150–450)
RBC: 4.66 M/uL (ref 4.05–5.2)
RDW: 13.8 % (ref 11.9–14.6)
WBC: 8.5 10*3/uL (ref 4.3–11.1)

## 2018-04-22 LAB — BASIC METABOLIC PANEL
Anion Gap: 6 mmol/L — ABNORMAL LOW (ref 7–16)
BUN: 11 MG/DL (ref 6–23)
CO2: 23 mmol/L (ref 21–32)
Calcium: 9.3 MG/DL (ref 8.3–10.4)
Chloride: 110 mmol/L — ABNORMAL HIGH (ref 98–107)
Creatinine: 1.01 MG/DL — ABNORMAL HIGH (ref 0.6–1.0)
EGFR IF NonAfrican American: >60 mL/min/{1.73_m2} (ref >60)
GFR African American: >60 mL/min/{1.73_m2} (ref >60)
Glucose: 99 mg/dL (ref 65–100)
Potassium: 4 mmol/L (ref 3.5–5.1)
Sodium: 139 mmol/L (ref 136–145)

## 2018-04-22 LAB — C-REACTIVE PROTEIN: CRP: <0.3 mg/dL (ref 0.0–0.9)

## 2018-04-22 MED ORDER — CEPHALEXIN 500 MG CAP
500 mg | ORAL_CAPSULE | Freq: Three times a day (TID) | ORAL | 0 refills | Status: DC
Start: 2018-04-22 — End: 2018-05-14

## 2018-04-22 MED ORDER — NAPROXEN SODIUM 550 MG TAB
550 mg | ORAL_TABLET | Freq: Two times a day (BID) | ORAL | 0 refills | Status: DC
Start: 2018-04-22 — End: 2018-12-24

## 2018-04-22 NOTE — ED Provider Notes (Signed)
ED Provider Notes by Roxan Hockey, MD at 04/22/18 1250                Author: Roxan Hockey, MD  Service: --  Author Type: Physician       Filed: 04/22/18 1423  Date of Service: 04/22/18 1250  Status: Addendum          Editor: Roxan Hockey, MD (Physician)          Related Notes: Original Note by Roxan Hockey, MD (Physician) filed at 04/22/18 1252               48 year old female complains of 24-hour history of pain is started in the arch of her right foot and spread to  the lateral aspect of the foot up into the ankle as well.  Feels like she has had swelling.  Some redness and more pain today especially with weightbearing.  No recent trauma.  She had a injury to her ankle last year but had no imaging.  No fever chills.   No history of gout.  No history of hypertension diabetes.  She takes gabapentin because of some tingling in her hands or arms.      The history is provided by the patient.    Foot Pain     This  is a new problem. The current episode started yesterday. The problem occurs constantly. The problem has been gradually worsening. The pain is present in the right foot and right ankle. The pain is moderate. Associated symptoms include limited range of motion. Pertinent negatives include no numbness and no back pain. The symptoms are aggravated by movement, palpation and standing. She  has tried nothing for the symptoms. There has been no history of extremity trauma.           History reviewed. No pertinent past medical history.        Past Surgical History:         Procedure  Laterality  Date          ?  HX ORTHOPAEDIC              right arm, toe             History reviewed. No pertinent family history.        Social History          Socioeconomic History         ?  Marital status:  SINGLE              Spouse name:  Not on file         ?  Number of children:  Not on file     ?  Years of education:  Not on file     ?  Highest education level:  Not on file       Occupational History         ?  Not on file       Social Needs         ?  Financial resource strain:  Not on file        ?  Food insecurity:              Worry:  Not on file         Inability:  Not on file        ?  Transportation needs:              Medical:  Not on file         Non-medical:  Not on file       Tobacco Use         ?  Smoking status:  Current Every Day Smoker              Packs/day:  0.50         ?  Smokeless tobacco:  Never Used       Substance and Sexual Activity         ?  Alcohol use:  Yes             Comment: 2 beers daily         ?  Drug use:  No     ?  Sexual activity:  Yes              Birth control/protection:  None       Lifestyle        ?  Physical activity:              Days per week:  Not on file         Minutes per session:  Not on file         ?  Stress:  Not on file       Relationships        ?  Social connections:              Talks on phone:  Not on file         Gets together:  Not on file         Attends religious service:  Not on file         Active member of club or organization:  Not on file         Attends meetings of clubs or organizations:  Not on file         Relationship status:  Not on file        ?  Intimate partner violence:              Fear of current or ex partner:  Not on file         Emotionally abused:  Not on file         Physically abused:  Not on file         Forced sexual activity:  Not on file        Other Topics  Concern        ?  Not on file       Social History Narrative        ?  Not on file              ALLERGIES: Patient has no known allergies.      Review of Systems    Constitutional: Negative for chills and fever.    Musculoskeletal: Negative for back pain.    Skin: Positive for color change. Negative for rash.    Neurological: Negative for weakness and numbness.            Vitals:          04/22/18 1229        BP:  123/71     Pulse:  90     Resp:  18     Temp:  98.7 ??F (37.1 ??C)     SpO2:  96%     Weight:  69.9 kg (154 lb)  Height:  5\' 5"  (1.651 m)                Physical  Exam   Vitals signs and nursing note reviewed.   Constitutional:        Appearance: She is not ill-appearing.   Musculoskeletal:      Right ankle:  Tenderness. Lateral malleolus and  medial malleolus tenderness found.        Feet:       Skin:      General: Skin is warm and dry.   Neurological :       Mental Status: She is alert.             MDM   Number of Diagnoses or Management Options   Diagnosis management comments: Possibility of tendinitis.  Patient has poor arches.  Check for gout.  No evidence for cellulitis.  Imaging to check for stress fracture.          Amount and/or Complexity of Data Reviewed   Clinical lab tests: reviewed and ordered   Tests in the radiology section of CPT??: ordered and reviewed   Independent visualization of images, tracings, or specimens: yes      Risk of Complications, Morbidity, and/or Mortality   Presenting problems: moderate  Diagnostic procedures: minimal     Patient Progress   Patient progress: stable             Procedures      Results Include:        Recent Results (from the past 24 hour(s))     CBC WITH AUTOMATED DIFF          Collection Time: 04/22/18  1:31 PM         Result  Value  Ref Range            WBC  8.5  4.3 - 11.1 K/uL       RBC  4.66  4.05 - 5.2 M/uL       HGB  14.1  11.7 - 15.4 g/dL       HCT  55.2  08.0 - 46.3 %       MCV  92.3  79.6 - 97.8 FL       MCH  30.3  26.1 - 32.9 PG       MCHC  32.8  31.4 - 35.0 g/dL       RDW  22.3  36.1 - 14.6 %       PLATELET  270  150 - 450 K/uL       MPV  9.8  9.4 - 12.3 FL       ABSOLUTE NRBC  0.00  0.0 - 0.2 K/uL       DF  AUTOMATED          NEUTROPHILS  67  43 - 78 %       LYMPHOCYTES  20  13 - 44 %       MONOCYTES  10  4.0 - 12.0 %       EOSINOPHILS  3  0.5 - 7.8 %       BASOPHILS  1  0.0 - 2.0 %       IMMATURE GRANULOCYTES  0  0.0 - 5.0 %       ABS. NEUTROPHILS  5.7  1.7 - 8.2 K/UL       ABS. LYMPHOCYTES  1.7  0.5 - 4.6 K/UL       ABS. MONOCYTES  0.8  0.1 - 1.3 K/UL       ABS. EOSINOPHILS  0.2  0.0 - 0.8 K/UL       ABS.  BASOPHILS  0.0  0.0 - 0.2 K/UL       ABS. IMM. GRANS.  0.0  0.0 - 0.5 K/UL       METABOLIC PANEL, BASIC          Collection Time: 04/22/18  1:31 PM         Result  Value  Ref Range            Sodium  139  136 - 145 mmol/L       Potassium  4.0  3.5 - 5.1 mmol/L       Chloride  110 (H)  98 - 107 mmol/L       CO2  23  21 - 32 mmol/L       Anion gap  6 (L)  7 - 16 mmol/L       Glucose  99  65 - 100 mg/dL       BUN  11  6 - 23 MG/DL       Creatinine  6.19 (H)  0.6 - 1.0 MG/DL       GFR est AA  >50  >60 ml/min/1.11m2       GFR est non-AA  >60  >60 ml/min/1.76m2       Calcium  9.3  8.3 - 10.4 MG/DL       C REACTIVE PROTEIN, QT          Collection Time: 04/22/18  1:31 PM         Result  Value  Ref Range            C-Reactive protein  <0.3  0.0 - 0.9 mg/dL       URIC ACID          Collection Time: 04/22/18  1:31 PM         Result  Value  Ref Range            Uric acid  5.2  2.6 - 6.0 MG/DL        Xr Ankle Rt Min 3 V      Result Date: 04/22/2018   History: Redness and swelling in right foot since yesterday without injury RIGHT ANKLE SERIES: The ankle mortise is well aligned. There is no displaced fracture. Soft tissue swelling is present in the lateral ankle. RIGHT FOOT SERIES: There is no displaced  fracture, malalignment or radiopaque foreign body.       IMPRESSION: Soft tissue swelling in lateral ankle.      Xr Foot Rt Min 3 V      Result Date: 04/22/2018   History: Redness and swelling in right foot since yesterday without injury RIGHT ANKLE SERIES: The ankle mortise is well aligned. There is no displaced fracture. Soft tissue swelling is present in the lateral ankle. RIGHT FOOT SERIES: There is no displaced  fracture, malalignment or radiopaque foreign body.       IMPRESSION: Soft tissue swelling in lateral ankle.      Has some erythema and warmth.  We will add antibiotics and as well as anti-inflammatory.  Rest and heat and recheck in 2 days.

## 2018-04-22 NOTE — ED Notes (Signed)
 I have reviewed discharge instructions with the patient.  The patient verbalized understanding.    Patient left ED via Discharge Method: ambulatory to Home with spouse .    Opportunity for questions and clarification provided.       Patient given 2 scripts.         To continue your aftercare when you leave the hospital, you may receive an automated call from our care team to check in on how you are doing.  This is a free service and part of our promise to provide the best care and service to meet your aftercare needs." If you have questions, or wish to unsubscribe from this service please call [phone].  Thank you for Choosing our [facility].

## 2018-04-22 NOTE — ED Notes (Signed)
Pt reports redness, swelling to the right foot since yesterday. H/O surgery to that foot.

## 2018-04-22 NOTE — ED Triage Notes (Signed)
Pt reports redness, swelling to the right foot since yesterday. H/O surgery to that foot.

## 2018-04-22 NOTE — ED Notes (Signed)
I have reviewed discharge instructions with the patient.  The patient verbalized understanding.    Patient left ED via Discharge Method: ambulatory to Home with spouse.    Opportunity for questions and clarification provided.       Patient given 2 scripts.         To continue your aftercare when you leave the hospital, you may receive an automated call from our care team to check in on how you are doing.  This is a free service and part of our promise to provide the best care and service to meet your aftercare needs.??? If you have questions, or wish to unsubscribe from this service please call [phone].  Thank you for Choosing our [facility].

## 2018-04-22 NOTE — ED Provider Notes (Addendum)
48 year old female complains of 24-hour history of pain is started in the arch of her right foot and spread to the lateral aspect of the foot up into the ankle as well.  Feels like she has had swelling.  Some redness and more pain today especially with weightbearing.  No recent trauma.  She had a injury to her ankle last year but had no imaging.  No fever chills.  No history of gout.  No history of hypertension diabetes.  She takes gabapentin because of some tingling in her hands or arms.    The history is provided by the patient.   Foot Pain    This is a new problem. The current episode started yesterday. The problem occurs constantly. The problem has been gradually worsening. The pain is present in the right foot and right ankle. The pain is moderate. Associated symptoms include limited range of motion. Pertinent negatives include no numbness and no back pain. The symptoms are aggravated by movement, palpation and standing. She has tried nothing for the symptoms. There has been no history of extremity trauma.        History reviewed. No pertinent past medical history.    Past Surgical History:   Procedure Laterality Date   ??? HX ORTHOPAEDIC      right arm, toe         History reviewed. No pertinent family history.    Social History     Socioeconomic History   ??? Marital status: SINGLE     Spouse name: Not on file   ??? Number of children: Not on file   ??? Years of education: Not on file   ??? Highest education level: Not on file   Occupational History   ??? Not on file   Social Needs   ??? Financial resource strain: Not on file   ??? Food insecurity:     Worry: Not on file     Inability: Not on file   ??? Transportation needs:     Medical: Not on file     Non-medical: Not on file   Tobacco Use   ??? Smoking status: Current Every Day Smoker     Packs/day: 0.50   ??? Smokeless tobacco: Never Used   Substance and Sexual Activity   ??? Alcohol use: Yes     Comment: 2 beers daily   ??? Drug use: No   ??? Sexual activity: Yes      Birth control/protection: None   Lifestyle   ??? Physical activity:     Days per week: Not on file     Minutes per session: Not on file   ??? Stress: Not on file   Relationships   ??? Social connections:     Talks on phone: Not on file     Gets together: Not on file     Attends religious service: Not on file     Active member of club or organization: Not on file     Attends meetings of clubs or organizations: Not on file     Relationship status: Not on file   ??? Intimate partner violence:     Fear of current or ex partner: Not on file     Emotionally abused: Not on file     Physically abused: Not on file     Forced sexual activity: Not on file   Other Topics Concern   ??? Not on file   Social History Narrative   ??? Not on file  ALLERGIES: Patient has no known allergies.    Review of Systems   Constitutional: Negative for chills and fever.   Musculoskeletal: Negative for back pain.   Skin: Positive for color change. Negative for rash.   Neurological: Negative for weakness and numbness.       Vitals:    04/22/18 1229   BP: 123/71   Pulse: 90   Resp: 18   Temp: 98.7 ??F (37.1 ??C)   SpO2: 96%   Weight: 69.9 kg (154 lb)   Height: 5\' 5"  (1.651 m)            Physical Exam  Vitals signs and nursing note reviewed.   Constitutional:       Appearance: She is not ill-appearing.   Musculoskeletal:      Right ankle: Tenderness. Lateral malleolus and medial malleolus tenderness found.        Feet:    Skin:     General: Skin is warm and dry.   Neurological:      Mental Status: She is alert.          MDM  Number of Diagnoses or Management Options  Diagnosis management comments: Possibility of tendinitis.  Patient has poor arches.  Check for gout.  No evidence for cellulitis.  Imaging to check for stress fracture.       Amount and/or Complexity of Data Reviewed  Clinical lab tests: reviewed and ordered  Tests in the radiology section of CPT??: ordered and reviewed  Independent visualization of images, tracings, or specimens: yes     Risk of Complications, Morbidity, and/or Mortality  Presenting problems: moderate  Diagnostic procedures: minimal    Patient Progress  Patient progress: stable         Procedures    Results Include:    Recent Results (from the past 24 hour(s))   CBC WITH AUTOMATED DIFF    Collection Time: 04/22/18  1:31 PM   Result Value Ref Range    WBC 8.5 4.3 - 11.1 K/uL    RBC 4.66 4.05 - 5.2 M/uL    HGB 14.1 11.7 - 15.4 g/dL    HCT 16.143.0 09.635.8 - 04.546.3 %    MCV 92.3 79.6 - 97.8 FL    MCH 30.3 26.1 - 32.9 PG    MCHC 32.8 31.4 - 35.0 g/dL    RDW 40.913.8 81.111.9 - 91.414.6 %    PLATELET 270 150 - 450 K/uL    MPV 9.8 9.4 - 12.3 FL    ABSOLUTE NRBC 0.00 0.0 - 0.2 K/uL    DF AUTOMATED      NEUTROPHILS 67 43 - 78 %    LYMPHOCYTES 20 13 - 44 %    MONOCYTES 10 4.0 - 12.0 %    EOSINOPHILS 3 0.5 - 7.8 %    BASOPHILS 1 0.0 - 2.0 %    IMMATURE GRANULOCYTES 0 0.0 - 5.0 %    ABS. NEUTROPHILS 5.7 1.7 - 8.2 K/UL    ABS. LYMPHOCYTES 1.7 0.5 - 4.6 K/UL    ABS. MONOCYTES 0.8 0.1 - 1.3 K/UL    ABS. EOSINOPHILS 0.2 0.0 - 0.8 K/UL    ABS. BASOPHILS 0.0 0.0 - 0.2 K/UL    ABS. IMM. GRANS. 0.0 0.0 - 0.5 K/UL   METABOLIC PANEL, BASIC    Collection Time: 04/22/18  1:31 PM   Result Value Ref Range    Sodium 139 136 - 145 mmol/L    Potassium 4.0 3.5 - 5.1 mmol/L  Chloride 110 (H) 98 - 107 mmol/L    CO2 23 21 - 32 mmol/L    Anion gap 6 (L) 7 - 16 mmol/L    Glucose 99 65 - 100 mg/dL    BUN 11 6 - 23 MG/DL    Creatinine 5.46 (H) 0.6 - 1.0 MG/DL    GFR est AA >27 >03 JK/[id number]/[id number]    GFR est non-AA >60 >60 ml/min/1.30m2    Calcium 9.3 8.3 - 10.4 MG/DL   C REACTIVE PROTEIN, QT    Collection Time: 04/22/18  1:31 PM   Result Value Ref Range    C-Reactive protein <0.3 0.0 - 0.9 mg/dL   URIC ACID    Collection Time: 04/22/18  1:31 PM   Result Value Ref Range    Uric acid 5.2 2.6 - 6.0 MG/DL     Xr Ankle Rt Min 3 V    Result Date: 04/22/2018  History: Redness and swelling in right foot since yesterday without injury  RIGHT ANKLE SERIES: The ankle mortise is well aligned. There is no displaced fracture. Soft tissue swelling is present in the lateral ankle. RIGHT FOOT SERIES: There is no displaced fracture, malalignment or radiopaque foreign body.     IMPRESSION: Soft tissue swelling in lateral ankle.    Xr Foot Rt Min 3 V    Result Date: 04/22/2018  History: Redness and swelling in right foot since yesterday without injury RIGHT ANKLE SERIES: The ankle mortise is well aligned. There is no displaced fracture. Soft tissue swelling is present in the lateral ankle. RIGHT FOOT SERIES: There is no displaced fracture, malalignment or radiopaque foreign body.     IMPRESSION: Soft tissue swelling in lateral ankle.    Has some erythema and warmth.  We will add antibiotics and as well as anti-inflammatory.  Rest and heat and recheck in 2 days.

## 2018-05-14 ENCOUNTER — Inpatient Hospital Stay
Admit: 2018-05-14 | Discharge: 2018-05-14 | Disposition: A | Discharge status: Home / Self Care | Payer: Self-pay | Attending: Emergency Medicine

## 2018-05-14 DIAGNOSIS — M25571 Pain in right ankle and joints of right foot: Secondary | ICD-10-CM

## 2018-05-14 MED ORDER — MELOXICAM 15 MG TAB
15 mg | ORAL_TABLET | Freq: Every day | ORAL | 0 refills | Status: DC
Start: 2018-05-14 — End: 2018-12-24

## 2018-05-14 NOTE — ED Notes (Signed)
 I have reviewed discharge instructions with the patient.  The patient verbalized understanding.    Patient left ED via Discharge Method: ambulatory to Home with self.    Opportunity for questions and clarification provided.       Patient given 1 scripts.         To continue your aftercare when you leave the hospital, you may receive an automated call from our care team to check in on how you are doing.  This is a free service and part of our promise to provide the best care and service to meet your aftercare needs." If you have questions, or wish to unsubscribe from this service please call [phone].  Thank you for Choosing our [facility].

## 2018-05-14 NOTE — ED Notes (Signed)
Pt reports continued pain to right foot and now extends into the ankle.

## 2018-05-14 NOTE — ED Provider Notes (Signed)
The patient is a 48 year old female presenting to the emerge department a complaining of right foot pain in the arch radiating into the ankle.  The patient was seen and evaluated here about 1 month ago and had cellulitis in the area did improve the redness.  She says that normally early on in the day the pain is not bad but after standing on it for 8 hours on the concrete floor the pain gets worse.  She is not wearing any kind of bracing and has not done any rehab.           History reviewed. No pertinent past medical history.    Past Surgical History:   Procedure Laterality Date   ??? HX ORTHOPAEDIC      right arm, toe         History reviewed. No pertinent family history.    Social History     Socioeconomic History   ??? Marital status: SINGLE     Spouse name: Not on file   ??? Number of children: Not on file   ??? Years of education: Not on file   ??? Highest education level: Not on file   Occupational History   ??? Not on file   Social Needs   ??? Financial resource strain: Not on file   ??? Food insecurity     Worry: Not on file     Inability: Not on file   ??? Transportation needs     Medical: Not on file     Non-medical: Not on file   Tobacco Use   ??? Smoking status: Current Every Day Smoker     Packs/day: 0.50   ??? Smokeless tobacco: Never Used   Substance and Sexual Activity   ??? Alcohol use: Yes     Comment: 2 beers daily   ??? Drug use: No   ??? Sexual activity: Yes     Birth control/protection: None   Lifestyle   ??? Physical activity     Days per week: Not on file     Minutes per session: Not on file   ??? Stress: Not on file   Relationships   ??? Social Wellsite geologist on phone: Not on file     Gets together: Not on file     Attends religious service: Not on file     Active member of club or organization: Not on file     Attends meetings of clubs or organizations: Not on file     Relationship status: Not on file   ??? Intimate partner violence     Fear of current or ex partner: Not on file     Emotionally abused: Not on file      Physically abused: Not on file     Forced sexual activity: Not on file   Other Topics Concern   ??? Not on file   Social History Narrative   ??? Not on file         ALLERGIES: Patient has no known allergies.    Review of Systems   Constitutional: Negative.    Musculoskeletal: Positive for joint swelling.   Skin: Negative.    Neurological: Negative.    Hematological: Negative.        Vitals:    05/14/18 1201 05/14/18 1250   BP: 128/75    Pulse: 70    Resp: 18    Temp: 98.7 ??F (37.1 ??C)    SpO2: 96% 96%   Weight: 69.9  kg (154 lb)    Height: 5\' 5"  (1.651 m)             Physical Exam     GENERAL:The patient is well nourished, and well-hydrated.  VITAL SIGNS: Heart rate, blood pressure, respiratory rate reviewed as recorded in  nurse's notes  EYES: Pupils reactive. Extraocular motion intact. No conjunctival redness or drainage.  LUNGS: No accessory muscle use  CARDIOVASCULAR: Regular rate and rhythm  EXTREMITIES: Patient has point tenderness along the plantar fascia at the head of the first metatarsal.  There is no deformity appreciated.  She also has some mild inflammation inferior to the medial malleolus on the right.  The patient has a negative anterior drawer test and no tenderness palpation over the bony prominences of the malleoli.  Negative Thompson's test  NEUROLOGIC: Cranial nerve exam reveals face is symmetrical, tongue is midline  speech is clear. No focal deficits noted  SKIN: No rash or petechiae. Good skin turgor palpated.  PSYCHIATRIC: Alert and oriented. Appropriate behavior and judgment.      MDM  Number of Diagnoses or Management Options  Diagnosis management comments: Plantar fasciitis, tendinitis, stress fracture,         Procedures

## 2018-05-14 NOTE — ED Notes (Signed)
I have reviewed discharge instructions with the patient.  The patient verbalized understanding.    Patient left ED via Discharge Method: ambulatory to Home with self.    Opportunity for questions and clarification provided.       Patient given 1 scripts.         To continue your aftercare when you leave the hospital, you may receive an automated call from our care team to check in on how you are doing.  This is a free service and part of our promise to provide the best care and service to meet your aftercare needs.??? If you have questions, or wish to unsubscribe from this service please call [phone].  Thank you for Choosing our [facility].

## 2018-05-14 NOTE — ED Provider Notes (Signed)
The patient is a 48 year old female presenting to the emerge department a complaining of right foot pain in the arch radiating into the ankle.  The patient was seen and evaluated here about 1 month ago and had cellulitis in the area did improve the redness.  She says that normally early on in the day the pain is not bad but after standing on it for 8 hours on the concrete floor the pain gets worse.  She is not wearing any kind of bracing and has not done any rehab.           History reviewed. No pertinent past medical history.    Past Surgical History:   Procedure Laterality Date   ??? HX ORTHOPAEDIC      right arm, toe         History reviewed. No pertinent family history.    Social History     Socioeconomic History   ??? Marital status: SINGLE     Spouse name: Not on file   ??? Number of children: Not on file   ??? Years of education: Not on file   ??? Highest education level: Not on file   Occupational History   ??? Not on file   Social Needs   ??? Financial resource strain: Not on file   ??? Food insecurity     Worry: Not on file     Inability: Not on file   ??? Transportation needs     Medical: Not on file     Non-medical: Not on file   Tobacco Use   ??? Smoking status: Current Every Day Smoker     Packs/day: 0.50   ??? Smokeless tobacco: Never Used   Substance and Sexual Activity   ??? Alcohol use: Yes     Comment: 2 beers daily   ??? Drug use: No   ??? Sexual activity: Yes     Birth control/protection: None   Lifestyle   ??? Physical activity     Days per week: Not on file     Minutes per session: Not on file   ??? Stress: Not on file   Relationships   ??? Social Wellsite geologist on phone: Not on file     Gets together: Not on file     Attends religious service: Not on file     Active member of club or organization: Not on file     Attends meetings of clubs or organizations: Not on file     Relationship status: Not on file   ??? Intimate partner violence     Fear of current or ex partner: Not on file     Emotionally abused: Not on file      Physically abused: Not on file     Forced sexual activity: Not on file   Other Topics Concern   ??? Not on file   Social History Narrative   ??? Not on file         ALLERGIES: Patient has no known allergies.    Review of Systems   Constitutional: Negative.    Musculoskeletal: Positive for joint swelling.   Skin: Negative.    Neurological: Negative.    Hematological: Negative.        Vitals:    05/14/18 1201 05/14/18 1250   BP: 128/75    Pulse: 70    Resp: 18    Temp: 98.7 ??F (37.1 ??C)    SpO2: 96% 96%   Weight: 69.9  kg (154 lb)    Height: 5' 5" (1.651 m)             Physical Exam     GENERAL:The patient is well nourished, and well-hydrated.  VITAL SIGNS: Heart rate, blood pressure, respiratory rate reviewed as recorded in  nurse's notes  EYES: Pupils reactive. Extraocular motion intact. No conjunctival redness or drainage.  LUNGS: No accessory muscle use  CARDIOVASCULAR: Regular rate and rhythm  EXTREMITIES: Patient has point tenderness along the plantar fascia at the head of the first metatarsal.  There is no deformity appreciated.  She also has some mild inflammation inferior to the medial malleolus on the right.  The patient has a negative anterior drawer test and no tenderness palpation over the bony prominences of the malleoli.  Negative Thompson's test  NEUROLOGIC: Cranial nerve exam reveals face is symmetrical, tongue is midline  speech is clear. No focal deficits noted  SKIN: No rash or petechiae. Good skin turgor palpated.  PSYCHIATRIC: Alert and oriented. Appropriate behavior and judgment.      MDM  Number of Diagnoses or Management Options  Diagnosis management comments: Plantar fasciitis, tendinitis, stress fracture,         Procedures

## 2018-05-14 NOTE — ED Triage Notes (Signed)
Pt reports continued pain to right foot and now extends into the ankle.

## 2018-08-18 ENCOUNTER — Inpatient Hospital Stay
Admit: 2018-08-18 | Discharge: 2018-08-18 | Disposition: A | Discharge status: Home / Self Care | Payer: Self-pay | Attending: Emergency Medicine

## 2018-08-18 ENCOUNTER — Emergency Department: Admit: 2018-08-18 | Payer: Self-pay | Primary: Family

## 2018-08-18 DIAGNOSIS — S161XXA Strain of muscle, fascia and tendon at neck level, initial encounter: Secondary | ICD-10-CM

## 2018-08-18 MED ORDER — IBUPROFEN 800 MG TAB
800 mg | ORAL_TABLET | Freq: Three times a day (TID) | ORAL | 0 refills | Status: AC | PRN
Start: 2018-08-18 — End: 2018-08-23

## 2018-08-18 MED ORDER — METHOCARBAMOL 750 MG TAB
750 mg | ORAL_TABLET | Freq: Three times a day (TID) | ORAL | 0 refills | Status: DC
Start: 2018-08-18 — End: 2018-12-24
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
## 2018-08-18 NOTE — ED Notes (Signed)
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
## 2018-08-18 NOTE — ED Notes (Signed)
I have reviewed discharge instructions with the patient.  The patient verbalized understanding.    Patient left ED via Discharge Method: ambulatory to Home with self.    Opportunity for questions and clarification provided.       Patient given 2 scripts.         To continue your aftercare when you leave the hospital, you may receive an automated call from our care team to check in on how you are doing.  This is a free service and part of our promise to provide the best care and service to meet your aftercare needs.??? If you have questions, or wish to unsubscribe from this service please call [phone].  Thank you for Choosing our [facility].
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
## 2018-12-24 ENCOUNTER — Inpatient Hospital Stay
Admit: 2018-12-24 | Discharge: 2018-12-25 | Disposition: A | Discharge status: Home / Self Care | Attending: Emergency Medicine

## 2018-12-24 ENCOUNTER — Emergency Department: Admit: 2018-12-25 | Primary: Family

## 2018-12-24 DIAGNOSIS — S161XXA Strain of muscle, fascia and tendon at neck level, initial encounter: Secondary | ICD-10-CM
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
## 2018-12-24 NOTE — ED Notes (Signed)
 I have reviewed discharge instructions with the patient.  The patient verbalized understanding.    Patient left ED via Discharge Method: ambulatory to Home with family member    Opportunity for questions and clarification provided.       Patient given 2 scripts.         To continue your aftercare when you leave the hospital, you may receive an automated call from our care team to check in on how you are doing.  This is a free service and part of our promise to provide the best care and service to meet your aftercare needs." If you have questions, or wish to unsubscribe from this service please call [phone].  Thank you for Choosing our [facility].
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
## 2018-12-24 NOTE — ED Notes (Signed)
I have reviewed discharge instructions with the patient.  The patient verbalized understanding.    Patient left ED via Discharge Method: ambulatory to Home with family member.     Opportunity for questions and clarification provided.       Patient given 2 scripts.         To continue your aftercare when you leave the hospital, you may receive an automated call from our care team to check in on how you are doing.  This is a free service and part of our promise to provide the best care and service to meet your aftercare needs.??? If you have questions, or wish to unsubscribe from this service please call [phone].  Thank you for Choosing our [facility].
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
## 2018-12-25 MED ORDER — IBUPROFEN 800 MG TAB
800 mg | ORAL_TABLET | Freq: Three times a day (TID) | ORAL | 0 refills | Status: AC | PRN
Start: 2018-12-25 — End: 2018-12-29

## 2018-12-25 MED ORDER — CYCLOBENZAPRINE 10 MG TAB
10 mg | ORAL_TABLET | Freq: Three times a day (TID) | ORAL | 0 refills | Status: DC | PRN
Start: 2018-12-25 — End: 2019-05-26

## 2019-02-13 ENCOUNTER — Inpatient Hospital Stay
Admit: 2019-02-13 | Discharge: 2019-02-13 | Disposition: A | Discharge status: Home / Self Care | Attending: Emergency Medicine

## 2019-02-13 ENCOUNTER — Emergency Department: Admit: 2019-02-13 | Primary: Family

## 2019-02-13 DIAGNOSIS — T148XXA Other injury of unspecified body region, initial encounter: Secondary | ICD-10-CM

## 2019-02-13 MED ORDER — NAPROXEN 500 MG TAB
500 mg | ORAL_TABLET | Freq: Two times a day (BID) | ORAL | 0 refills | Status: AC
Start: 2019-02-13 — End: 2019-02-23
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
## 2019-02-13 NOTE — ED Notes (Signed)
 I have reviewed discharge instructions with the patient.  The patient verbalized understanding.    Patient left ED via Discharge Method: ambulatory to Home with self.    Opportunity for questions and clarification provided.       Patient given 1 scripts.         To continue your aftercare when you leave the hospital, you may receive an automated call from our care team to check in on how you are doing.  This is a free service and part of our promise to provide the best care and service to meet your aftercare needs." If you have questions, or wish to unsubscribe from this service please call [phone].  Thank you for Choosing our [facility].
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
## 2019-02-13 NOTE — ED Notes (Signed)
I have reviewed discharge instructions with the patient.  The patient verbalized understanding.    Patient left ED via Discharge Method: ambulatory to Home with self.    Opportunity for questions and clarification provided.       Patient given 1 scripts.         To continue your aftercare when you leave the hospital, you may receive an automated call from our care team to check in on how you are doing.  This is a free service and part of our promise to provide the best care and service to meet your aftercare needs.??? If you have questions, or wish to unsubscribe from this service please call [phone].  Thank you for Choosing our [facility].
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
## 2019-05-26 ENCOUNTER — Emergency Department: Admit: 2019-05-26 | Primary: Family

## 2019-05-26 ENCOUNTER — Inpatient Hospital Stay
Admit: 2019-05-26 | Discharge: 2019-05-26 | Disposition: A | Discharge status: Home / Self Care | Attending: Emergency Medicine

## 2019-05-26 DIAGNOSIS — M5442 Lumbago with sciatica, left side: Secondary | ICD-10-CM

## 2019-05-26 MED ORDER — PREDNISONE 50 MG TAB
50 mg | ORAL_TABLET | Freq: Every day | ORAL | 0 refills | Status: AC
Start: 2019-05-26 — End: 2019-05-29

## 2019-05-26 MED ORDER — METHOCARBAMOL 500 MG TAB
500 mg | ORAL_TABLET | Freq: Three times a day (TID) | ORAL | 0 refills | Status: AC
Start: 2019-05-26 — End: 2019-06-02

## 2019-05-26 MED ORDER — NAPROXEN 500 MG TAB
500 mg | ORAL_TABLET | Freq: Two times a day (BID) | ORAL | 0 refills | Status: DC
Start: 2019-05-26 — End: 2019-10-09

## 2019-05-26 MED ORDER — KETOROLAC TROMETHAMINE 30 MG/ML INJECTION
30 mg/mL (1 mL) | INTRAMUSCULAR | Status: AC
Start: 2019-05-26 — End: 2019-05-26
  Administered 2019-05-26: 15:00:00 via INTRAMUSCULAR

## 2019-05-26 MED FILL — KETOROLAC TROMETHAMINE 30 MG/ML INJECTION: 30 mg/mL (1 mL) | INTRAMUSCULAR | Qty: 1
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
## 2019-05-26 NOTE — ED Notes (Signed)
I have reviewed discharge instructions with the patient.  The patient verbalized understanding.    Patient left ED via Discharge Method: ambulatory to Home with sister.    Opportunity for questions and clarification provided.       Patient given 3 scripts.         To continue your aftercare when you leave the hospital, you may receive an automated call from our care team to check in on how you are doing.  This is a free service and part of our promise to provide the best care and service to meet your aftercare needs." If you have questions, or wish to unsubscribe from this service please call [phone].  Thank you for Choosing our [facility].
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
## 2019-05-26 NOTE — ED Notes (Signed)
I have reviewed discharge instructions with the patient.  The patient verbalized understanding.    Patient left ED via Discharge Method: ambulatory to Home with (sister).    Opportunity for questions and clarification provided.       Patient given 3 scripts.         To continue your aftercare when you leave the hospital, you may receive an automated call from our care team to check in on how you are doing.  This is a free service and part of our promise to provide the best care and service to meet your aftercare needs.??? If you have questions, or wish to unsubscribe from this service please call [phone].  Thank you for Choosing our [facility].

## 2019-08-20 ENCOUNTER — Inpatient Hospital Stay
Admit: 2019-08-20 | Discharge: 2019-08-20 | Disposition: A | Discharge status: Home / Self Care | Attending: Emergency Medicine

## 2019-08-20 DIAGNOSIS — S39011A Strain of muscle, fascia and tendon of abdomen, initial encounter: Secondary | ICD-10-CM

## 2019-08-20 MED ORDER — CYCLOBENZAPRINE 10 MG TAB
10 mg | ORAL_TABLET | Freq: Three times a day (TID) | ORAL | 0 refills | Status: DC | PRN
Start: 2019-08-20 — End: 2019-10-09

## 2019-08-20 MED ORDER — NAPROXEN SODIUM 550 MG TAB
550 mg | ORAL_TABLET | Freq: Two times a day (BID) | ORAL | 0 refills | Status: DC
Start: 2019-08-20 — End: 2019-10-09

## 2019-08-20 NOTE — ED Notes (Signed)
Pt reports worsening left groin pain.

## 2019-08-20 NOTE — ED Provider Notes (Signed)
49 year old presents to the emergency department complaining of left groin pain.  States it began earlier today.  Reports history of similar left groin strain in the past.  Denies any known trauma today.  Does work for [employer] and has to get up and down into the truck.  Denies difficulty ambulating but does endorse pain to her groin with ambulation and lifting her leg.  Denies fever, chills, chest pain or shortness of breath.  Denies taking any medication for this prior to arrival.           History reviewed. No pertinent past medical history.    Past Surgical History:   Procedure Laterality Date   ??? HX ORTHOPAEDIC      right arm, toe         History reviewed. No pertinent family history.    Social History     Socioeconomic History   ??? Marital status: SINGLE     Spouse name: Not on file   ??? Number of children: Not on file   ??? Years of education: Not on file   ??? Highest education level: Not on file   Occupational History   ??? Not on file   Tobacco Use   ??? Smoking status: Current Every Day Smoker     Packs/day: 0.50   ??? Smokeless tobacco: Never Used   Substance and Sexual Activity   ??? Alcohol use: Yes     Comment: 2 beers daily   ??? Drug use: No   ??? Sexual activity: Yes     Birth control/protection: None   Other Topics Concern   ??? Not on file   Social History Narrative   ??? Not on file     Social Determinants of Health     Financial Resource Strain:    ??? Difficulty of Paying Living Expenses:    Food Insecurity:    ??? Worried About Charity fundraiser in the Last Year:    ??? Arboriculturist in the Last Year:    Transportation Needs:    ??? Film/video editor (Medical):    ??? Lack of Transportation (Non-Medical):    Physical Activity:    ??? Days of Exercise per Week:    ??? Minutes of Exercise per Session:    Stress:    ??? Feeling of Stress :    Social Connections:    ??? Frequency of Communication with Friends and Family:    ??? Frequency of Social Gatherings with Friends and Family:    ??? Attends Religious Services:    ??? Building surveyor or Organizations:    ??? Attends Music therapist:    ??? Marital Status:    Intimate Production manager Violence:    ??? Fear of Current or Ex-Partner:    ??? Emotionally Abused:    ??? Physically Abused:    ??? Sexually Abused:          ALLERGIES: Patient has no known allergies.    Review of Systems   Constitutional: Negative for chills and fever.   HENT: Negative for sinus pressure and sore throat.    Eyes: Negative for visual disturbance.   Respiratory: Negative for cough and shortness of breath.    Cardiovascular: Negative for chest pain.   Gastrointestinal: Negative for abdominal pain, diarrhea, nausea and vomiting.   Endocrine: Negative for polyuria.   Genitourinary: Negative for difficulty urinating and dysuria.   Musculoskeletal: Positive for myalgias. Negative for neck pain and neck stiffness.  Skin: Negative for rash.   Neurological: Negative for weakness and headaches.   Psychiatric/Behavioral: Negative for confusion.   All other systems reviewed and are negative.      Vitals:    08/20/19 1640   BP: 123/68   Pulse: 69   Resp: 18   Temp: 99 ??F (37.2 ??C)   SpO2: 97%   Weight: 64.4 kg (142 lb)   Height: 5\' 5"  (1.651 m)            Physical Exam  Vitals and nursing note reviewed.   Constitutional:       Appearance: Normal appearance. She is not ill-appearing or toxic-appearing.   HENT:      Head: Normocephalic and atraumatic.      Nose: Nose normal.      Mouth/Throat:      Mouth: Mucous membranes are moist.   Eyes:      Extraocular Movements: Extraocular movements intact.   Cardiovascular:      Rate and Rhythm: Normal rate and regular rhythm.      Pulses: Normal pulses.      Heart sounds: Normal heart sounds.   Pulmonary:      Effort: Pulmonary effort is normal. No respiratory distress.      Breath sounds: Normal breath sounds.   Abdominal:      General: Abdomen is flat. There is no distension.      Palpations: Abdomen is soft.      Tenderness: There is no abdominal tenderness.   Musculoskeletal:          General: Tenderness present. No swelling or deformity. Normal range of motion.      Cervical back: Normal range of motion. No rigidity.      Left lower leg: No edema.      Comments: Tenderness with palpation of the anterior aspect of the proximal left lower extremity.   Skin:     General: Skin is warm and dry.   Neurological:      General: No focal deficit present.      Mental Status: She is alert and oriented to person, place, and time.   Psychiatric:         Mood and Affect: Mood normal.          MDM  Number of Diagnoses or Management Options  Strain of left groin  Diagnosis management comments: 49 year old female presents ER with left lower extremity groin pain.  States she has history of similar strains in the past.  Denies taking medication for this.  We will treat for musculoskeletal groin strain.  Will treat with naproxen and Flexeril.  Sedation precautions given.  Patient will follow up with family physician in 3 to 4 days.  Return to the ER for worsening or worrisome symptoms.  She voiced understanding agreement with this plan.    Risk of Complications, Morbidity, and/or Mortality  Presenting problems: low  Diagnostic procedures: low  Management options: low           Procedures

## 2019-08-20 NOTE — ED Notes (Signed)
I have reviewed discharge instructions with the patient.  The patient verbalized understanding.    Patient left ED via Discharge Method: ambulatory to Home with self.    Opportunity for questions and clarification provided.       Patient given 1 scripts.         To continue your aftercare when you leave the hospital, you may receive an automated call from our care team to check in on how you are doing.  This is a free service and part of our promise to provide the best care and service to meet your aftercare needs." If you have questions, or wish to unsubscribe from this service please call [phone].  Thank you for Choosing our [facility].

## 2019-10-09 ENCOUNTER — Inpatient Hospital Stay
Admit: 2019-10-09 | Discharge: 2019-10-09 | Disposition: A | Discharge status: Home / Self Care | Attending: Emergency Medicine

## 2019-10-09 ENCOUNTER — Emergency Department: Admit: 2019-10-09 | Primary: Family

## 2019-10-09 DIAGNOSIS — L03115 Cellulitis of right lower limb: Secondary | ICD-10-CM

## 2019-10-09 MED ORDER — DOXYCYCLINE HYCLATE 100 MG TAB
100 mg | ORAL_TABLET | Freq: Two times a day (BID) | ORAL | 0 refills | Status: AC
Start: 2019-10-09 — End: 2019-10-19

## 2019-10-09 NOTE — ED Notes (Signed)
I have reviewed discharge instructions with the patient.  The patient verbalized understanding.    Patient left ED via Discharge Method: ambulatory to Home with self.    Opportunity for questions and clarification provided.       Patient given 1 scripts.         To continue your aftercare when you leave the hospital, you may receive an automated call from our care team to check in on how you are doing.  This is a free service and part of our promise to provide the best care and service to meet your aftercare needs." If you have questions, or wish to unsubscribe from this service please call [phone].  Thank you for Choosing our [facility].

## 2019-10-09 NOTE — ED Provider Notes (Signed)
ED Provider Notes by Joanie Coddington, PA at 10/09/19 1426                Author: Joanie Coddington, PA  Service: Emergency Medicine  Author Type: Physician Assistant       Filed: 10/09/19 1627  Date of Service: 10/09/19 1426  Status: Attested           Editor: Leonor Liv Charm Barges, PA (Physician Assistant)  Cosigner: Standley Brooking, MD at 10/12/19 0845          Attestation signed by Standley Brooking, MD at 10/12/19 0845          I was personally available for consultation in the emergency department.  The patient was seen and cared for primarily by the midlevel provider. Standley Brooking,  MD                                  Patient is here with right leg pain, redness and swelling that started yesterday. She started working at [employer] on  Monday, standing all day. This was 4 days ago. This has never happened before. No chest pain, shortness of breath, abdominal pain, fever, nausea, vomiting or injury to the area. She did ambulate to the room without difficulty and is well-hydrated.  Patient  is also having pain on the bottom of her heel however that has been bothering her for over a year and she wanted [place] to reevaluate it today.  She has not reinjured it.      The history is provided by the patient.    Leg Pain    This is a new problem. The  current episode started yesterday. The problem occurs  constantly. The pain is present in the right lower leg. The quality of the pain is described as aching. The pain is at a severity of 8/10. The  pain is moderate. Pertinent negatives include  no numbness, full range of motion, no stiffness, no tingling, no itching, no back pain and no neck pain. The symptoms  are aggravated by movement, standing and activity. She  has tried nothing for the symptoms. There has been no history of extremity trauma.           No past medical history on file.        Past Surgical History:         Procedure  Laterality  Date          ?  HX ORTHOPAEDIC              right arm, toe              No family history on file.        Social History          Socioeconomic History         ?  Marital status:  SINGLE              Spouse name:  Not on file         ?  Number of children:  Not on file     ?  Years of education:  Not on file     ?  Highest education level:  Not on file       Occupational History        ?  Not on file       Tobacco Use         ?  Smoking status:  Current Every Day Smoker              Packs/day:  0.50         ?  Smokeless tobacco:  Never Used       Substance and Sexual Activity         ?  Alcohol use:  Yes             Comment: 2 beers daily         ?  Drug use:  No     ?  Sexual activity:  Yes              Birth control/protection:  None        Other Topics  Concern        ?  Not on file       Social History Narrative        ?  Not on file          Social Determinants of Health          Financial Resource Strain:         ?  Difficulty of Paying Living Expenses:        Food Insecurity:         ?  Worried About Programme researcher, broadcasting/film/video in the Last Year:      ?  Barista in the Last Year:        Transportation Needs:         ?  Freight forwarder (Medical):      ?  Lack of Transportation (Non-Medical):        Physical Activity:         ?  Days of Exercise per Week:      ?  Minutes of Exercise per Session:        Stress:         ?  Feeling of Stress :        Social Connections:         ?  Frequency of Communication with Friends and Family:      ?  Frequency of Social Gatherings with Friends and Family:      ?  Attends Religious Services:      ?  Active Member of Clubs or Organizations:      ?  Attends Banker Meetings:      ?  Marital Status:        Intimate Partner Violence:         ?  Fear of Current or Ex-Partner:      ?  Emotionally Abused:      ?  Physically Abused:         ?  Sexually Abused:               ALLERGIES: Patient has no known allergies.      Review of Systems    Constitutional: Negative.     HENT: Negative.     Eyes: Negative.     Respiratory:  Negative.     Cardiovascular: Negative.     Gastrointestinal: Negative.     Genitourinary: Negative.     Musculoskeletal: Negative.  Negative for back pain, neck pain and stiffness.         Right lower leg redness and swelling, right heel pain    Skin: Negative.  Negative for itching.    Neurological: Negative.  Negative for tingling and  numbness.    Psychiatric/Behavioral: Negative.     All other systems reviewed and are negative.         There were no vitals filed for this visit.           Physical Exam   Vitals and nursing note reviewed.   Constitutional:        Appearance: She is well-developed.   HENT :       Head: Normocephalic and atraumatic.      Right Ear: External ear normal.      Left Ear: External ear normal.      Nose: Nose normal.   Eyes:       Conjunctiva/sclera: Conjunctivae normal.      Pupils: Pupils are equal, round, and reactive to light.   Cardiovascular:       Rate and Rhythm: Normal rate and regular rhythm.      Pulses: Normal pulses.      Heart sounds: Normal heart sounds.    Pulmonary:       Effort: Pulmonary effort is normal.      Breath sounds: Normal breath sounds.   Abdominal :      General: Bowel sounds are normal.      Palpations: Abdomen is soft.     Musculoskeletal:          General: Swelling and tenderness  present. Normal range of motion.      Cervical back: Normal range of motion and neck supple.        Legs:           Feet:       Skin:      General: Skin is warm and dry.   Neurological :       General: No focal deficit present.      Mental Status: She is alert and oriented to person, place, and time.      Deep Tendon Reflexes: Reflexes are normal and symmetric.   Psychiatric:         Mood and Affect: Mood normal.         Behavior: Behavior  normal.         Thought Content: Thought content normal.         Judgment: Judgment normal.              MDM   Number of Diagnoses or Management Options       Amount and/or Complexity of Data Reviewed   Tests in the radiology section of CPT??:  ordered and reviewed      Risk of Complications, Morbidity, and/or Mortality   Presenting problems: moderate  Diagnostic procedures: moderate  Management options: moderate     Patient Progress   Patient progress: stable             Procedures      The patient was observed in the ED.      Results Reviewed:     DUPLEX LOWER EXT VENOUS RIGHT       Final Result     1. No sonographic evidence for DVT.     2. Mildly prominent right inguinal lymph nodes.                 XR FOOT RT MIN 3 V       Final Result     Unremarkable right foot radiograph. No acute osseous abnormality or     joint derangement evident.  Patient with redness, and pain to her right lower extremity and some enlarged lymph nodes in her right groin.  No DVT on ultrasound.  This is consistent with cellulitis.  Patient was encouraged to gently wash the area 2-3 times a day with soap and water,  use warm moist heat and finish all of the antibiotics.  Return to the ED if she is worsening in any way.  The x-ray is negative on her foot and she was shown some stretching exercises for plantar fasciitis.  Follow-up with orthopedics if needed.  Return  to the ED if worsening in any way.  She is stable for discharge and ambulatory out of the ED without difficulty at this time.   I discussed the results of all labs, procedures, radiographs, and treatments with the patient and available family.  Treatment plan is agreed upon and the patient is ready for discharge.   All voiced understanding of the discharge plan and medication instructions or changes as appropriate.  Questions about treatment in the ED were answered.  All were encouraged to return should symptoms worsen or new problems develop.

## 2019-10-09 NOTE — ED Notes (Signed)
Pt c/o pain and swelling in right lower leg near the ankle that started yesterday, pt denies any injury. Pt denies long car ride, denies shortness of breath. Pt also c/o right foot pain for 2-3 weeks.

## 2020-02-04 ENCOUNTER — Emergency Department: Admit: 2020-02-04 | Primary: Family

## 2020-02-04 ENCOUNTER — Inpatient Hospital Stay
Admit: 2020-02-04 | Discharge: 2020-02-04 | Disposition: A | Discharge status: Home / Self Care | Attending: Emergency Medicine

## 2020-02-04 DIAGNOSIS — M722 Plantar fascial fibromatosis: Secondary | ICD-10-CM

## 2020-02-04 LAB — CBC WITH AUTOMATED DIFF
ABS. BASOPHILS: 0 10*3/uL (ref 0.0–0.2)
ABS. EOSINOPHILS: 0.3 10*3/uL (ref 0.0–0.8)
ABS. IMM. GRANS.: 0 10*3/uL (ref 0.0–0.5)
ABS. LYMPHOCYTES: 1.9 10*3/uL (ref 0.5–4.6)
ABS. MONOCYTES: 0.9 10*3/uL (ref 0.1–1.3)
ABS. NEUTROPHILS: 4.1 10*3/uL (ref 1.7–8.2)
ABSOLUTE NRBC: 0 10*3/uL (ref 0.0–0.2)
BASOPHILS: 1 % (ref 0.0–2.0)
EOSINOPHILS: 4 % (ref 0.5–7.8)
HCT: 42 % (ref 35.8–46.3)
HGB: 13.8 g/dL (ref 11.7–15.4)
IMMATURE GRANULOCYTES: 0 % (ref 0.0–5.0)
LYMPHOCYTES: 26 % (ref 13–44)
MCH: 30.3 PG (ref 26.1–32.9)
MCHC: 32.9 g/dL (ref 31.4–35.0)
MCV: 92.3 FL (ref 79.6–97.8)
MONOCYTES: 12 % (ref 4.0–12.0)
MPV: 9.9 FL (ref 9.4–12.3)
NEUTROPHILS: 57 % (ref 43–78)
PLATELET: 226 10*3/uL (ref 150–450)
RBC: 4.55 M/uL (ref 4.05–5.2)
RDW: 13.1 % (ref 11.9–14.6)
WBC: 7.1 10*3/uL (ref 4.3–11.1)

## 2020-02-04 LAB — METABOLIC PANEL, COMPREHENSIVE
A-G Ratio: 0.9 — ABNORMAL LOW (ref 1.2–3.5)
ALT (SGPT): 25 U/L (ref 12–65)
AST (SGOT): 20 U/L (ref 15–37)
Albumin: 3.7 g/dL (ref 3.5–5.0)
Alk. phosphatase: 90 U/L (ref 50–136)
Anion gap: 5 mmol/L — ABNORMAL LOW (ref 7–16)
BUN: 17 MG/DL (ref 6–23)
Bilirubin, total: 0.5 MG/DL (ref 0.2–1.1)
CO2: 27 mmol/L (ref 21–32)
Calcium: 8.9 MG/DL (ref 8.3–10.4)
Chloride: 109 mmol/L — ABNORMAL HIGH (ref 98–107)
Creatinine: 0.9 MG/DL (ref 0.6–1.0)
GFR est AA: >60 mL/min/{1.73_m2} (ref >60)
GFR est non-AA: >60 mL/min/{1.73_m2} (ref >60)
Globulin: 4.1 g/dL — ABNORMAL HIGH (ref 2.3–3.5)
Glucose: 100 mg/dL (ref 65–100)
Potassium: 3.9 mmol/L (ref 3.5–5.1)
Protein, total: 7.8 g/dL (ref 6.3–8.2)
Sodium: 141 mmol/L (ref 136–145)

## 2020-02-04 LAB — LACTIC ACID
Lactic Acid: 0.9 MMOL/L (ref 0.4–2.0)
Lactic acid: 0.9 MMOL/L (ref 0.4–2.0)

## 2020-02-04 LAB — C REACTIVE PROTEIN, QT: C-Reactive protein: <0.3 mg/dL (ref 0.0–0.9)

## 2020-02-04 LAB — CBC WITH AUTO DIFFERENTIAL
Basophils %: 1 % (ref 0.0–2.0)
Basophils Absolute: 0 10*3/uL (ref 0.0–0.2)
Eosinophils %: 4 % (ref 0.5–7.8)
Eosinophils Absolute: 0.3 10*3/uL (ref 0.0–0.8)
Granulocyte Absolute Count: 0 10*3/uL (ref 0.0–0.5)
Hematocrit: 42 % (ref 35.8–46.3)
Hemoglobin: 13.8 g/dL (ref 11.7–15.4)
Immature Granulocytes: 0 % (ref 0.0–5.0)
Lymphocytes %: 26 % (ref 13–44)
Lymphocytes Absolute: 1.9 10*3/uL (ref 0.5–4.6)
MCH: 30.3 PG (ref 26.1–32.9)
MCHC: 32.9 g/dL (ref 31.4–35.0)
MCV: 92.3 FL (ref 79.6–97.8)
MPV: 9.9 FL (ref 9.4–12.3)
Monocytes %: 12 % (ref 4.0–12.0)
Monocytes Absolute: 0.9 10*3/uL (ref 0.1–1.3)
NRBC Absolute: 0 10*3/uL (ref 0.0–0.2)
Neutrophils %: 57 % (ref 43–78)
Neutrophils Absolute: 4.1 10*3/uL (ref 1.7–8.2)
Platelets: 226 10*3/uL (ref 150–450)
RBC: 4.55 M/uL (ref 4.05–5.2)
RDW: 13.1 % (ref 11.9–14.6)
WBC: 7.1 10*3/uL (ref 4.3–11.1)

## 2020-02-04 LAB — COMPREHENSIVE METABOLIC PANEL
ALT: 25 U/L (ref 12–65)
AST: 20 U/L (ref 15–37)
Albumin/Globulin Ratio: 0.9 — ABNORMAL LOW (ref 1.2–3.5)
Albumin: 3.7 g/dL (ref 3.5–5.0)
Alkaline Phosphatase: 90 U/L (ref 50–136)
Anion Gap: 5 mmol/L — ABNORMAL LOW (ref 7–16)
BUN: 17 MG/DL (ref 6–23)
CO2: 27 mmol/L (ref 21–32)
Calcium: 8.9 MG/DL (ref 8.3–10.4)
Chloride: 109 mmol/L — ABNORMAL HIGH (ref 98–107)
Creatinine: 0.9 MG/DL (ref 0.6–1.0)
EGFR IF NonAfrican American: >60 mL/min/{1.73_m2} (ref >60)
GFR African American: >60 mL/min/{1.73_m2} (ref >60)
Globulin: 4.1 g/dL — ABNORMAL HIGH (ref 2.3–3.5)
Glucose: 100 mg/dL (ref 65–100)
Potassium: 3.9 mmol/L (ref 3.5–5.1)
Sodium: 141 mmol/L (ref 136–145)
Total Bilirubin: 0.5 MG/DL (ref 0.2–1.1)
Total Protein: 7.8 g/dL (ref 6.3–8.2)

## 2020-02-04 LAB — C-REACTIVE PROTEIN: CRP: <0.3 mg/dL (ref 0.0–0.9)

## 2020-02-04 MED ORDER — MELOXICAM 15 MG TAB
15 mg | ORAL_TABLET | Freq: Every day | ORAL | 0 refills | Status: AC
Start: 2020-02-04 — End: ?

## 2020-02-04 NOTE — ED Notes (Signed)
Pt reports red, swollen, painful right foot, it has hurt for "a minute".

## 2020-02-04 NOTE — ED Notes (Unsigned)
Patient here from [facility], right foot arch, red, swollen, warm, elevated blood sugar by nurse there. No hx of diabetes. +Tenderness to palpation over right foot arch with redness and swelling. Patient evaluated initially in triage.  Rapid Medical Evaluation was conducted and necessary orders have been placed.  I have performed a medical screening exam.  Care will now be transferred to the provider in the back of the emergency department.  Joanie Coddington, PA 3:00 PM

## 2020-02-04 NOTE — ED Provider Notes (Signed)
ED Provider Notes by Phillips Grout, MD at 02/04/20 1628                Author: Phillips Grout, MD  Service: --  Author Type: Physician       Filed: 02/04/20 1630  Date of Service: 02/04/20 1628  Status: Signed          Editor: Phillips Grout, MD (Physician)               Patient presents the ER, complaints of right foot pain.  Patient states she has had recurrence of her right foot  pain for the past 3 months.  States pain is mostly daily.  States pain is worse with standing.  States most of the pain is on the plantar aspect of her foot.  Denies any trauma.  Denies fevers or chills.  Denies any leg pain or leg swelling.      The history is provided by the patient.    Foot Pain    This  is a chronic problem. The current episode started more than 1 week  ago. The problem occurs constantly. The problem has not changed since onset.The pain is present in the right foot. The quality of the pain is described as aching. The  pain is at a severity of 3/10. The pain is mild. Associated symptoms include stiffness. Pertinent negatives include no numbness, no back pain and no neck pain. She  has tried nothing for the symptoms. There has been no history of extremity trauma.           History reviewed. No pertinent past medical history.        Past Surgical History:         Procedure  Laterality  Date          ?  HX ORTHOPAEDIC              right arm, toe             History reviewed. No pertinent family history.        Social History          Socioeconomic History         ?  Marital status:  SINGLE              Spouse name:  Not on file         ?  Number of children:  Not on file     ?  Years of education:  Not on file     ?  Highest education level:  Not on file       Occupational History        ?  Not on file       Tobacco Use         ?  Smoking status:  Current Every Day Smoker              Packs/day:  0.50         ?  Smokeless tobacco:  Never Used       Substance and Sexual Activity         ?  Alcohol use:  Yes              Comment: 2 beers daily         ?  Drug use:  No     ?  Sexual activity:  Yes  Birth control/protection:  None        Other Topics  Concern        ?  Not on file       Social History Narrative        ?  Not on file          Social Determinants of Health          Financial Resource Strain:         ?  Difficulty of Paying Living Expenses: Not on file       Food Insecurity:         ?  Worried About Running Out of Food in the Last Year: Not on file     ?  Ran Out of Food in the Last Year: Not on file       Transportation Needs:         ?  Lack of Transportation (Medical): Not on file     ?  Lack of Transportation (Non-Medical): Not on file       Physical Activity:         ?  Days of Exercise per Week: Not on file     ?  Minutes of Exercise per Session: Not on file       Stress:         ?  Feeling of Stress : Not on file       Social Connections:         ?  Frequency of Communication with Friends and Family: Not on file     ?  Frequency of Social Gatherings with Friends and Family: Not on file     ?  Attends Religious Services: Not on file     ?  Active Member of Clubs or Organizations: Not on file     ?  Attends Archivist Meetings: Not on file     ?  Marital Status: Not on file       Intimate Partner Violence:         ?  Fear of Current or Ex-Partner: Not on file     ?  Emotionally Abused: Not on file     ?  Physically Abused: Not on file     ?  Sexually Abused: Not on file       Housing Stability:         ?  Unable to Pay for Housing in the Last Year: Not on file     ?  Number of Places Lived in the Last Year: Not on file        ?  Unstable Housing in the Last Year: Not on file              ALLERGIES: Patient has no known allergies.      Review of Systems    Constitutional: Negative for diaphoresis and fatigue.    HENT: Negative for congestion, trouble swallowing and voice change.     Eyes: Negative for photophobia and visual disturbance.    Respiratory: Negative for choking, chest tightness,  shortness of breath and stridor.     Cardiovascular: Negative for chest pain and leg swelling.    Gastrointestinal: Negative for abdominal pain, anal bleeding, diarrhea, rectal pain and vomiting.    Endocrine: Negative for polyphagia and polyuria.    Genitourinary: Negative for decreased urine volume, genital sores, hematuria, pelvic pain and vaginal pain.    Musculoskeletal: Positive for stiffness. Negative for back  pain, gait problem, neck pain and neck stiffness.    Skin: Negative for color change and pallor.    Neurological: Negative for tremors, weakness, numbness and headaches.    Hematological: Negative for adenopathy. Does not bruise/bleed easily.    Psychiatric/Behavioral: Negative for behavioral problems, confusion, dysphoric mood and self-injury. The patient is not nervous/anxious.     All other systems reviewed and are negative.           Vitals:          02/04/20 1458        BP:  (!) 143/86     Pulse:  69     Resp:  18     Temp:  98.6 ??F (37 ??C)     SpO2:  99%     Weight:  64 kg (141 lb)        Height:  _0  (1.651 m)                Physical Exam   Vitals and nursing note reviewed.   Constitutional:        General: She is not in acute distress.     Appearance: Normal appearance. She is not ill-appearing.    HENT:       Head: Normocephalic and atraumatic.      Right Ear: External ear normal.      Left Ear: External ear normal.      Nose: Nose normal. No congestion or rhinorrhea.   Eyes:       General:         Right eye: No discharge.      Extraocular Movements: Extraocular movements intact.      Pupils: Pupils are equal, round, and reactive to light.   Cardiovascular :       Rate and Rhythm: Normal rate and regular rhythm.      Pulses: Normal pulses.      Heart sounds: Normal heart sounds.    Pulmonary:       Effort: Pulmonary effort is normal. No respiratory distress.      Breath sounds: Normal breath sounds. No stridor.    Abdominal:      General: Abdomen is flat. Bowel sounds are normal. There is no  distension.      Palpations: Abdomen is soft. There is no mass.     Musculoskeletal:          General: No swelling or signs of injury. Normal range of motion.      Cervical back: Normal range of motion and neck supple. No rigidity or tenderness.      Right foot: Tenderness  present.        Feet:       Skin:      Coloration: Skin is not jaundiced or pale.   Neurological :       General: No focal deficit present.      Mental Status: She is alert and oriented to person, place, and time.      Cranial Nerves: No cranial nerve deficit.      Sensory: No sensory deficit.      Motor: No weakness.   Psychiatric:          Mood and Affect: Mood normal.         Behavior: Behavior normal.              MDM   Number of Diagnoses or Management Options   Diagnosis management comments: Exam with clinical history  is consistent with likely plantar fasciitis.  X-rays and labs were obtained in triage which appear normal.  Plan to discharge home.  Placed on NSAIDs.  Give follow-up with podiatry.          Amount and/or Complexity of Data Reviewed   Clinical lab tests: ordered and reviewed   Tests in the radiology section of CPT??: ordered and reviewed      Risk of Complications, Morbidity, and/or Mortality   Presenting problems: low  Diagnostic procedures: low  Management options: low               Procedures                      Results Include:        Recent Results (from the past 24 hour(s))     CBC WITH AUTOMATED DIFF          Collection Time: 02/04/20  3:36 PM         Result  Value  Ref Range            WBC  7.1  4.3 - 11.1 K/uL       RBC  4.55  4.05 - 5.2 M/uL       HGB  13.8  11.7 - 15.4 g/dL       HCT  42.0  35.8 - 46.3 %       MCV  92.3  79.6 - 97.8 FL       MCH  30.3  26.1 - 32.9 PG       MCHC  32.9  31.4 - 35.0 g/dL       RDW  13.1  11.9 - 14.6 %       PLATELET  226  150 - 450 K/uL       MPV  9.9  9.4 - 12.3 FL       ABSOLUTE NRBC  0.00  0.0 - 0.2 K/uL       DF  AUTOMATED          NEUTROPHILS  57  43 - 78 %       LYMPHOCYTES  26   13 - 44 %       MONOCYTES  12  4.0 - 12.0 %       EOSINOPHILS  4  0.5 - 7.8 %       BASOPHILS  1  0.0 - 2.0 %       IMMATURE GRANULOCYTES  0  0.0 - 5.0 %       ABS. NEUTROPHILS  4.1  1.7 - 8.2 K/UL       ABS. LYMPHOCYTES  1.9  0.5 - 4.6 K/UL       ABS. MONOCYTES  0.9  0.1 - 1.3 K/UL       ABS. EOSINOPHILS  0.3  0.0 - 0.8 K/UL       ABS. BASOPHILS  0.0  0.0 - 0.2 K/UL       ABS. IMM. GRANS.  0.0  0.0 - 0.5 K/UL       METABOLIC PANEL, COMPREHENSIVE          Collection Time: 02/04/20  3:36 PM         Result  Value  Ref Range            Sodium  141  136 - 145 mmol/L       Potassium  3.9  3.5 - 5.1 mmol/L  Chloride  109 (H)  98 - 107 mmol/L       CO2  27  21 - 32 mmol/L       Anion gap  5 (L)  7 - 16 mmol/L       Glucose  100  65 - 100 mg/dL       BUN  17  6 - 23 MG/DL       Creatinine  0.90  0.6 - 1.0 MG/DL       GFR est AA  >60  >60 ml/min/1.73m       GFR est non-AA  >60  >60 ml/min/1.728m      Calcium  8.9  8.3 - 10.4 MG/DL       Bilirubin, total  0.5  0.2 - 1.1 MG/DL       ALT (SGPT)  25  12 - 65 U/L       AST (SGOT)  20  15 - 37 U/L       Alk. phosphatase  90  50 - 136 U/L       Protein, total  7.8  6.3 - 8.2 g/dL       Albumin  3.7  3.5 - 5.0 g/dL       Globulin  4.1 (H)  2.3 - 3.5 g/dL       A-G Ratio  0.9 (L)  1.2 - 3.5         LACTIC ACID          Collection Time: 02/04/20  3:36 PM         Result  Value  Ref Range            Lactic acid  0.9  0.4 - 2.0 MMOL/L       C REACTIVE PROTEIN, QT          Collection Time: 02/04/20  3:36 PM         Result  Value  Ref Range            C-Reactive protein  <0.3  0.0 - 0.9 mg/dL        Voice dictation software was used during the making of this note.  This software is not perfect and grammatical and other typographical errors may be present.  This note has been proofread, but may still contain errors.   Ry[place]illips GroutMD; 02/04/2020  _0 :30 PM    ===================================================================

## 2020-02-04 NOTE — ED Notes (Signed)
 I have reviewed discharge instructions with the patient.  The patient verbalized understanding.    Patient left ED via Discharge Method: ambulatory to Home with self.    Opportunity for questions and clarification provided.       Patient given 1 scripts.         To continue your aftercare when you leave the hospital, you may receive an automated call from our care team to check in on how you are doing.  This is a free service and part of our promise to provide the best care and service to meet your aftercare needs." If you have questions, or wish to unsubscribe from this service please call [phone].  Thank you for Choosing our [facility].

## 2020-02-04 NOTE — ED Notes (Signed)
Pt refused IV placement.

## 2021-01-16 IMAGING — MR MRI CERVICAL SPINE WITHOUT CONTRAST
1 series · 5 of 5 positions shown · non-contrast
Comparison: none

﻿ MR CERVICAL SPINE
INDICATION: Neck pain, evaluate for disc disease.
TECHNIQUE: Multisequence multiplanar MRI of the cervical spine was performed using standard protocol on a low field strength open MRI scanner.

[Series 3: scano sag/cor · sagittal · 6.0mm · 1.02mm/px · 5 of 5 slices shown]
[im 1/5]
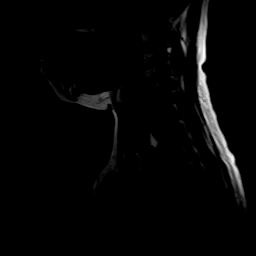
[im 2/5]
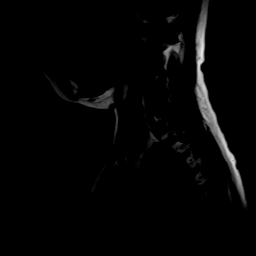
[im 3/5]
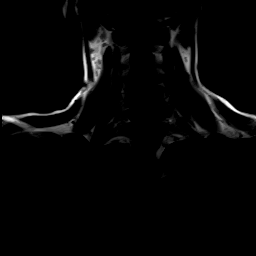
[im 4/5]
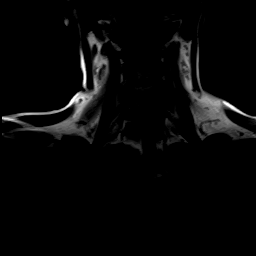
[im 5/5]
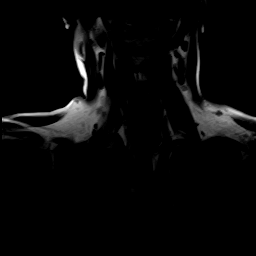

[5 of 5 positions shown; findings below may reference images not displayed]

FINDINGS: Visualized portions of the posterior fossa are unremarkable. The bone marrow and cord signal appears normal. There are no masses in the central canal or paraspinal region seen. Prevertebral soft tissues are normal. The atlanto-dens interval is intact. The craniocervical junction appears normal.  Cervical vertebral bodies demonstrate normal anatomical alignment.  There is straightening of the normal cervical lordosis. Specific findings are seen at the following levels:

C2-C3:  No disc herniation, central canal stenosis, or neuroforaminal narrowing.

C3-C4:  No disc herniation, central canal stenosis, or neuroforaminal narrowing.

C4-C5:  No disc herniation, central canal stenosis, or neuroforaminal narrowing.

C5-C6:  Disc osteophyte complex with superimposed central/left paracentral disc herniation, effacing the ventral thecal sac resulting in mild central canal stenosis.  Uncovertebral joint hypertrophy contributes to mild bilateral neuroforaminal narrowing.

C6-C7: Disc osteophyte complex with posterior ridging.  No disc herniation or central canal stenosis.  Uncovertebral hypertrophy contributes to mild bilateral neuroforaminal narrowing.

C7-T1: No disc herniation, central canal stenosis, or neuroforaminal narrowing.
IMPRESSION: Disc osteophyte complex at C5-6 with superimposed central/left paracentral disc herniation contributes to mild central canal stenosis.

Degenerative disc disease at C6-7 with mild bilateral neuroforaminal narrowing.

## 2021-01-18 IMAGING — MR MRI LUMBAR SPINE WITHOUT CONTRAST
1 series · 4 of 4 positions shown · non-contrast
Comparison: none

﻿MRI LUMBAR SPINE
INDICATION: Back pain
TECHNIQUE: MRI lumbar spine was performed. Multiplanar multisequence imaging was performed using standard protocol on a low field strength open MRI scanner.

[Series 3: s-c scano · sagittal · 6.0mm · 1.17mm/px · 4 of 4 slices shown]
[im 1/4]
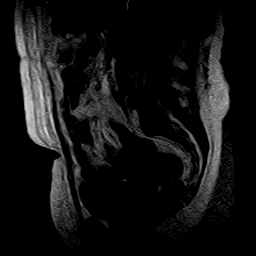
[im 2/4]
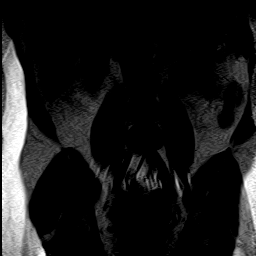
[im 3/4]
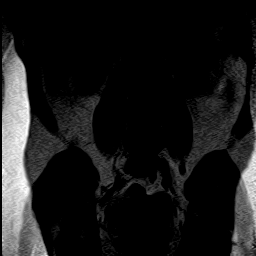
[im 4/4]
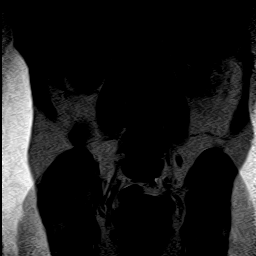

[4 of 4 positions shown; findings below may reference images not displayed]

FINDINGS: There are 5 lumbar type vertebrae. Vertebral body heights, vertebral alignment, and marrow signal are within normal limits.  Specific findings are identified at the following levels: 

L1-L2: Not included on the axial images.

L2-L3: No disc herniation, central canal stenosis, or neuroforaminal narrowing.

L3-L4: There is disc desiccation with small anterior marginal osteophytes.  No disc herniation, central canal stenosis, or neuroforaminal narrowing.  There is moderate bilateral facet arthrosis.

L4-L5: Circumferential disc bulging with small osteophytes.  There is moderate bilateral facet arthrosis.  No disc herniation, central canal stenosis, or neuroforaminal narrowing.

L5-S1: No disc herniation, central canal stenosis, or neuroforaminal narrowing.

The visualized SI joints, paraspinal muscles and retroperitoneum are unremarkable.
IMPRESSION: Mild degenerative disc disease and moderate facet arthrosis at L3-4 and L4-5.

## 2021-05-25 IMAGING — MR MRI WRIST LT W/O CONTRAST
1 series · 5 of 5 positions shown · non-contrast
Comparison: None.

﻿MRI LEFT WRIST WITHOUT CONTRAST
INDICATION: Left wrist pain and swelling.  DOI: 05/05/2020
TECHNIQUE: MRI of the wrist was performed on a low field strength open MRI scanner. Multiplanar multisequence imaging was performed including coronal T1, PD and IR; axial PD and IR; sagittal T2 sequences without contrast.

[Series 4: scano s/t/c · axial · left · 10.0mm · 1.02mm/px · z∈[+15,+130]mm · 5 of 5 slices shown]
[im 1/5]
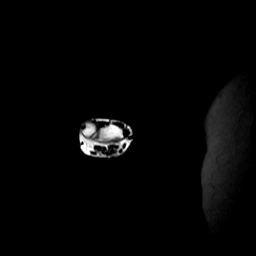
[im 2/5]
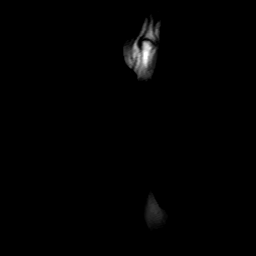
[im 3/5]
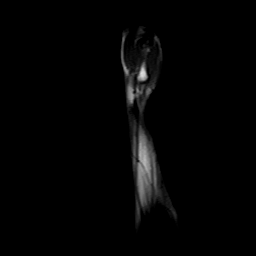
[im 4/5]
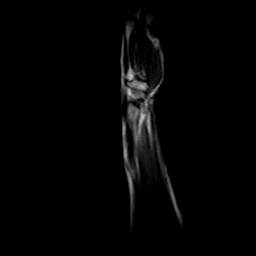
[im 5/5]
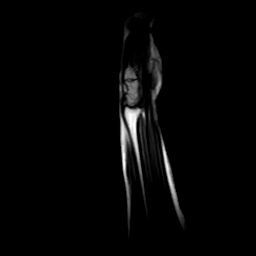

[5 of 5 positions shown; findings below may reference images not displayed]

FINDINGS: Bone:  Normal signal without evidence of fracture. 

Tendons: There is abnormal intermediate intrasubstance within the first extensor compartment tendons with peritendinous edema, compatible with first extensor compartment tenosynovitis.  Additionally, there appears to be a lobulated calcification adjacent to the first extensor compartment tendon, which may reflect hydroxyapatite deposition resulting in calcific tendonitis.  The other extensor and flexor tendons are within normal limits.

Ligaments: Within the limitations of the examination, the scapholunate ligament, lunotriquetral ligament, and triangular fibrocartilage complex are within normal limits. 

Nerve: The median nerve demonstrate normal signal characteristics and morphology.

Other: No joint effusion or synovitis.
IMPRESSION: First extensor compartment tendinosis and tenosynovitis which may be on the basis of an adjacent hydroxyapatite calcific deposit.  Radiographic correlation is suggested.

## 2021-10-23 ENCOUNTER — Inpatient Hospital Stay
Admit: 2021-10-23 | Discharge: 2021-10-23 | Disposition: A | Discharge status: Home / Self Care | Attending: Emergency Medicine

## 2021-10-23 DIAGNOSIS — J302 Other seasonal allergic rhinitis: Secondary | ICD-10-CM

## 2021-10-23 MED ORDER — PREDNISONE 20 MG PO TABS
20 MG | ORAL_TABLET | Freq: Two times a day (BID) | ORAL | 0 refills | Status: AC
Start: 2021-10-23 — End: 2021-10-28

## 2021-10-23 NOTE — ED Notes (Signed)
I have reviewed discharge instructions with the patient.  The patient verbalized understanding.    Patient left ED via Discharge Method: ambulatory to Home with self.    Opportunity for questions and clarification provided.       Patient given 1 scripts.         To continue your aftercare when you leave the hospital, you may receive an automated call from our care team to check in on how you are doing.  This is a free service and part of our promise to provide the best care and service to meet your aftercare needs." If you have questions, or wish to unsubscribe from this service please call [phone].  Thank you for Choosing our [facility] Emergency Department.        Merlene Pulling, RN  10/23/21 1424

## 2021-10-23 NOTE — ED Triage Notes (Signed)
Sneezing and sniffs for 3 days with a headache.  Concerned that she has a sinus infection.  Also, concerned about a rash on her hands and back that began in july

## 2021-10-23 NOTE — ED Provider Notes (Signed)
Emergency Department Provider Note       PCP: Julio Sicks, APRN - NP   Age: 51 y.o.   Sex: female     DISPOSITION Decision To Discharge 10/23/2021 01:42:58 PM       ICD-10-CM    1. Seasonal allergies  J30.2           Medical Decision Making     Complexity of Problems Addressed:  Complexity of Problem: 1 acute, uncomplicated illness or injury.    Data Reviewed and Analyzed:  I independently ordered and reviewed each unique test.        Discussion of management or test interpretation.  51 year old female presenting today for sneezing, nasal congestion and rhinorrhea.  Vital signs stable.  She had 2 negative COVID test at home.  Exam reassuring.  Discussed symptoms may be due to viral URI versus seasonal allergies, though I suspect the latter given her history.  Recommend daily antihistamine, short course of steroids.  Do not feel antibiotics are warranted at this time.  She will return for any worsening symptoms.       Risk of Complications and/or Morbidity of Patient Management:  Shared medical decision making was utilized in creating the patients health plan today.    History     Brandy Poole is a 51 y.o. female who presents to the Emergency Department with chief complaint of    Chief Complaint   Patient presents with    Facial Pain      51 year old female presenting to the emergency department today for evaluation of sneezing, nasal congestion, sinus pressure and headache.  Symptoms began 3 days ago and have been constant since onset.  No associated fever, chills or body aches.  No sore throat, cough, shortness of breath.  Endorses history of seasonal allergies, [place]  Denies any known sick contacts.  She has not tried any over-the-counter antihistamines.  She    The history is provided by the patient. No language interpreter was used.      Physical Exam     Vitals signs and nursing note reviewed.   Vitals:    10/23/21 1229   BP: 111/71   Pulse: 91   Resp: 16   Temp:  99.1 F (37.3 C)   TempSrc: Oral   SpO2: 97%   Weight: 184 lb 2 oz (83.5 kg)   Height: 5\' 5"  (1.651 m)       Physical Exam  Vitals and nursing note reviewed.   Constitutional:       General: She is not in acute distress.     Appearance: Normal appearance.   HENT:      Head: Normocephalic and atraumatic.      Right Ear: Tympanic membrane and ear canal normal.      Left Ear: Tympanic membrane and ear canal normal.      Nose: Congestion and rhinorrhea (clear) present.      Mouth/Throat:      Mouth: Mucous membranes are moist.      Pharynx: Oropharynx is clear.   Eyes:      Conjunctiva/sclera: Conjunctivae normal.   Cardiovascular:      Rate and Rhythm: Normal rate and regular rhythm.      Heart sounds: No murmur heard.  Pulmonary:      Effort: Pulmonary effort is normal.   Skin:     General: Skin is warm and dry.      Capillary Refill: Capillary refill  takes less than 2 seconds.   Neurological:      General: No focal deficit present.      Mental Status: She is alert and oriented to person, place, and time.   Psychiatric:         Mood and Affect: Mood normal.         Thought Content: Thought content normal.         Judgment: Judgment normal.        Procedures     Procedures     No orders of the defined types were placed in this encounter.       Medications given during this emergency department visit:  Medications - No data to display    New Prescriptions    PREDNISONE (DELTASONE) 20 MG TABLET    Take 1 tablet by mouth 2 times daily for 5 days        History reviewed. No pertinent past medical history.     Past Surgical History:   Procedure Laterality Date    ORTHOPEDIC SURGERY      right arm, toe        No results found for any visits on 10/23/21.     No orders to display         Voice dictation software was used during the making of this note.  This software is not perfect and grammatical and other typographical errors may be present.  This note has not been completely proofread for errors.      [place],  [place]  10/23/21 1347

## 2021-10-23 NOTE — Discharge Instructions (Signed)
Take medications as prescribed in addition to daily antihistamine    Follow-up with your PCP in 1 to 2 days if no improvement.  Return to the ER for any new or worsening symptoms.
